# Patient Record
Sex: Female | Born: 1955 | Race: White | Hispanic: No | Marital: Married | Smoking: Former smoker
Health system: Southern US, Community
[De-identification: ages and names within clinical notes are randomized; demographics above are authoritative.]

## PROBLEM LIST (undated history)

## (undated) DIAGNOSIS — C801 Malignant (primary) neoplasm, unspecified: Secondary | ICD-10-CM

## (undated) DIAGNOSIS — E669 Obesity, unspecified: Secondary | ICD-10-CM

## (undated) DIAGNOSIS — E039 Hypothyroidism, unspecified: Secondary | ICD-10-CM

## (undated) DIAGNOSIS — Z9889 Other specified postprocedural states: Secondary | ICD-10-CM

## (undated) HISTORY — DX: Hypothyroidism, unspecified: E03.9

## (undated) HISTORY — DX: Obesity, unspecified: E66.9

## (undated) HISTORY — DX: Malignant (primary) neoplasm, unspecified: C80.1

## (undated) HISTORY — PX: OTHER SURGICAL HISTORY: SHX169

## (undated) HISTORY — PX: ABDOMINAL HYSTERECTOMY: SHX81

---

## 1994-02-09 HISTORY — PX: OTHER SURGICAL HISTORY: SHX169

## 1999-01-13 ENCOUNTER — Other Ambulatory Visit: Admission: RE | Admit: 1999-01-13 | Discharge: 1999-01-13 | Payer: Self-pay | Admitting: Gynecology

## 1999-01-30 ENCOUNTER — Ambulatory Visit (HOSPITAL_COMMUNITY): Admission: RE | Admit: 1999-01-30 | Discharge: 1999-01-30 | Payer: Self-pay | Admitting: Gynecology

## 1999-01-30 ENCOUNTER — Encounter: Payer: Self-pay | Admitting: Gynecology

## 1999-02-10 HISTORY — PX: BREAST SURGERY: SHX581

## 1999-02-14 ENCOUNTER — Ambulatory Visit (HOSPITAL_COMMUNITY): Admission: RE | Admit: 1999-02-14 | Discharge: 1999-02-14 | Payer: Self-pay | Admitting: Gynecology

## 1999-02-14 ENCOUNTER — Encounter: Payer: Self-pay | Admitting: Gynecology

## 1999-03-19 ENCOUNTER — Encounter (INDEPENDENT_AMBULATORY_CARE_PROVIDER_SITE_OTHER): Payer: Self-pay | Admitting: Specialist

## 1999-03-19 ENCOUNTER — Other Ambulatory Visit: Admission: RE | Admit: 1999-03-19 | Discharge: 1999-03-19 | Payer: Self-pay | Admitting: Surgery

## 1999-08-19 ENCOUNTER — Encounter: Payer: Self-pay | Admitting: Surgery

## 1999-08-19 ENCOUNTER — Encounter: Admission: RE | Admit: 1999-08-19 | Discharge: 1999-08-19 | Payer: Self-pay | Admitting: Surgery

## 2000-01-16 ENCOUNTER — Other Ambulatory Visit: Admission: RE | Admit: 2000-01-16 | Discharge: 2000-01-16 | Payer: Self-pay | Admitting: Gynecology

## 2001-02-24 ENCOUNTER — Other Ambulatory Visit: Admission: RE | Admit: 2001-02-24 | Discharge: 2001-02-24 | Payer: Self-pay | Admitting: Gynecology

## 2002-05-18 ENCOUNTER — Other Ambulatory Visit: Admission: RE | Admit: 2002-05-18 | Discharge: 2002-05-18 | Payer: Self-pay | Admitting: Gynecology

## 2003-02-16 ENCOUNTER — Ambulatory Visit (HOSPITAL_COMMUNITY): Admission: RE | Admit: 2003-02-16 | Discharge: 2003-02-16 | Payer: Self-pay | Admitting: Internal Medicine

## 2003-03-07 ENCOUNTER — Ambulatory Visit (HOSPITAL_COMMUNITY): Admission: RE | Admit: 2003-03-07 | Discharge: 2003-03-07 | Payer: Self-pay | Admitting: Internal Medicine

## 2003-03-07 ENCOUNTER — Encounter: Payer: Self-pay | Admitting: Cardiology

## 2003-05-24 ENCOUNTER — Other Ambulatory Visit: Admission: RE | Admit: 2003-05-24 | Discharge: 2003-05-24 | Payer: Self-pay | Admitting: Gynecology

## 2004-04-17 ENCOUNTER — Encounter (INDEPENDENT_AMBULATORY_CARE_PROVIDER_SITE_OTHER): Payer: Self-pay | Admitting: Specialist

## 2004-04-17 ENCOUNTER — Ambulatory Visit (HOSPITAL_BASED_OUTPATIENT_CLINIC_OR_DEPARTMENT_OTHER): Admission: RE | Admit: 2004-04-17 | Discharge: 2004-04-17 | Payer: Self-pay | Admitting: Gynecology

## 2004-06-20 ENCOUNTER — Other Ambulatory Visit: Admission: RE | Admit: 2004-06-20 | Discharge: 2004-06-20 | Payer: Self-pay | Admitting: Gynecology

## 2005-03-24 ENCOUNTER — Ambulatory Visit: Payer: Self-pay | Admitting: Internal Medicine

## 2005-03-25 ENCOUNTER — Ambulatory Visit: Payer: Self-pay | Admitting: Internal Medicine

## 2005-03-26 ENCOUNTER — Encounter: Admission: RE | Admit: 2005-03-26 | Discharge: 2005-06-24 | Payer: Self-pay | Admitting: Gynecology

## 2005-06-10 ENCOUNTER — Other Ambulatory Visit: Admission: RE | Admit: 2005-06-10 | Discharge: 2005-06-10 | Payer: Self-pay | Admitting: Gynecology

## 2005-06-30 ENCOUNTER — Encounter: Admission: RE | Admit: 2005-06-30 | Discharge: 2005-06-30 | Payer: Self-pay | Admitting: Endocrinology

## 2005-07-14 ENCOUNTER — Other Ambulatory Visit: Admission: RE | Admit: 2005-07-14 | Discharge: 2005-07-14 | Payer: Self-pay | Admitting: Interventional Radiology

## 2005-07-14 ENCOUNTER — Encounter: Admission: RE | Admit: 2005-07-14 | Discharge: 2005-07-14 | Payer: Self-pay | Admitting: Endocrinology

## 2005-07-14 ENCOUNTER — Encounter (INDEPENDENT_AMBULATORY_CARE_PROVIDER_SITE_OTHER): Payer: Self-pay | Admitting: *Deleted

## 2005-11-30 ENCOUNTER — Encounter: Admission: RE | Admit: 2005-11-30 | Discharge: 2005-11-30 | Payer: Self-pay | Admitting: Endocrinology

## 2006-05-11 HISTORY — PX: ENDOMETRIAL ABLATION: SHX621

## 2006-05-19 ENCOUNTER — Encounter (INDEPENDENT_AMBULATORY_CARE_PROVIDER_SITE_OTHER): Payer: Self-pay | Admitting: *Deleted

## 2006-05-19 ENCOUNTER — Ambulatory Visit (HOSPITAL_BASED_OUTPATIENT_CLINIC_OR_DEPARTMENT_OTHER): Admission: RE | Admit: 2006-05-19 | Discharge: 2006-05-19 | Payer: Self-pay | Admitting: Gynecology

## 2006-05-27 ENCOUNTER — Encounter: Admission: RE | Admit: 2006-05-27 | Discharge: 2006-05-27 | Payer: Self-pay | Admitting: Endocrinology

## 2006-06-15 ENCOUNTER — Other Ambulatory Visit: Admission: RE | Admit: 2006-06-15 | Discharge: 2006-06-15 | Payer: Self-pay | Admitting: Gynecology

## 2006-10-18 ENCOUNTER — Encounter: Payer: Self-pay | Admitting: Internal Medicine

## 2006-10-18 DIAGNOSIS — E039 Hypothyroidism, unspecified: Secondary | ICD-10-CM | POA: Insufficient documentation

## 2006-10-18 DIAGNOSIS — R609 Edema, unspecified: Secondary | ICD-10-CM | POA: Insufficient documentation

## 2007-07-06 ENCOUNTER — Other Ambulatory Visit: Admission: RE | Admit: 2007-07-06 | Discharge: 2007-07-06 | Payer: Self-pay | Admitting: Gynecology

## 2007-11-10 ENCOUNTER — Ambulatory Visit: Payer: Self-pay | Admitting: Gynecology

## 2008-01-18 ENCOUNTER — Ambulatory Visit: Payer: Self-pay | Admitting: Gynecology

## 2008-01-19 ENCOUNTER — Ambulatory Visit: Payer: Self-pay | Admitting: Genetic Counselor

## 2008-01-27 ENCOUNTER — Ambulatory Visit: Payer: Self-pay | Admitting: Gynecology

## 2008-07-06 ENCOUNTER — Other Ambulatory Visit: Admission: RE | Admit: 2008-07-06 | Discharge: 2008-07-06 | Payer: Self-pay | Admitting: Gynecology

## 2008-07-06 ENCOUNTER — Encounter: Payer: Self-pay | Admitting: Gynecology

## 2008-07-06 ENCOUNTER — Ambulatory Visit: Payer: Self-pay | Admitting: Gynecology

## 2008-07-16 ENCOUNTER — Ambulatory Visit: Payer: Self-pay | Admitting: Gynecology

## 2008-07-19 ENCOUNTER — Encounter: Admission: RE | Admit: 2008-07-19 | Discharge: 2008-07-19 | Payer: Self-pay | Admitting: Endocrinology

## 2009-08-07 ENCOUNTER — Ambulatory Visit: Payer: Self-pay | Admitting: Gynecology

## 2009-08-07 ENCOUNTER — Other Ambulatory Visit: Admission: RE | Admit: 2009-08-07 | Discharge: 2009-08-07 | Payer: Self-pay | Admitting: Gynecology

## 2009-08-19 ENCOUNTER — Encounter: Admission: RE | Admit: 2009-08-19 | Discharge: 2009-08-19 | Payer: Self-pay | Admitting: Endocrinology

## 2009-10-03 ENCOUNTER — Ambulatory Visit: Payer: Self-pay | Admitting: Gynecology

## 2009-12-06 ENCOUNTER — Encounter: Admission: RE | Admit: 2009-12-06 | Discharge: 2009-12-06 | Payer: Self-pay | Admitting: Endocrinology

## 2010-03-02 ENCOUNTER — Encounter: Payer: Self-pay | Admitting: Surgery

## 2010-06-27 NOTE — Op Note (Signed)
[name], Carrie              ACCOUNT NO.:  [id number]   MEDICAL RECORD NO.:  [id number]          PATIENT TYPE:  AMB   LOCATION:  [facility]                         FACILITY:  [facility]   PHYSICIAN:  Juan H. Lily Peer, M.D.DATE OF BIRTH:  October 25, 1955   DATE OF PROCEDURE:  05/19/2006  DATE OF DISCHARGE:                               OPERATIVE REPORT   SURGEON:  Juan H. Lily Peer, M.D.   INDICATIONS FOR SURGERY:  A 55 year old gravida 2, para 2, menopause  patient with submucous myoma.   PREOPERATIVE DIAGNOSES:  1,  Submucous myoma.  2  Postmenopausal.   POSTOPERATIVE DIAGNOSES:  1,  Submucous myoma.  2  Postmenopausal.   ANESTHESIA:  General endotracheal anesthesia.   PROCEDURE PERFORMED:  Resectoscopic endometrial myomectomy.   FINDINGS:  Normal cervical canal.  A 2 x 3 cm submucous myoma with a  base from the anterior uterine wall.  Normal tubal ostia.  Myoma  hypervascular.   DESCRIPTION OF PROCEDURE:  After the patient was adequate counseled, she  was taken to the operating room where she had undergone successful  general endotracheal anesthesia.  She had received a gram of cefixime  for prophylaxis the day before in the office.  She had a Laminaria place  intracervically which was removed prior to commencing the procedure  today.  The vagina and cervix were prepped and draped in the usual  sterile fashion.  A red rubber Roxan Hockey was then inserted to evacuate  the bladder of its contents of approximately 75 cc.  A single tooth  tenaculum was placed on the anterior cervical lip.  A short weighted  speculum was placed in the posterior vaginal vault and a Sims retractor  anteriorly for exposure.  The operative resectoscope with a 90 degree  wire loop was inserted int the intrauterine cavity.  Three percent  Sorbitol was the distending media.  The settings on the [facility]  electrosurgical generator were 160 watts on the cutting mode and 70  watts on the coagulation mode.  The  base was identified and the  submucous myoma was resected piece by piece, all the way to the base.  Specimen was submitted for histological evaluation.  The operative  resectoscope 90 degree wire loop was then switched to the vapor strobe  and the settings were 90 on the cutting mode and 70 watts on the  coagulation mode.  The base of the myoma was ablated for additional  hemostasis,  pre and post procedures were obtained.  The patient  tolerated the procedure well.  The anterior cervical single tooth tenaculum was removed.  The patient  was extubated, transferred to the recovery room with stable vital signs.  Blood loss was minimal.  Fluid resuscitation consisted of 600 cc of  lactated Ringer's.  Three percent Sorbitol fluid deficit was 200 cc.  She received 30 mg of Toradol en route to the recovery room.      Juan H. Lily Peer, M.D.  Electronically Signed     JHF/MEDQ  D:  05/19/2006  T:  05/19/2006  Job:  [id number]

## 2010-06-27 NOTE — H&P (Signed)
NAMEVEDIKA, DUMLAO              ACCOUNT NO.:  [id number]   MEDICAL RECORD NO.:  [id number]          PATIENT TYPE:  AMB   LOCATION:  [facility]                         FACILITY:  [facility]   PHYSICIAN:  Juan H. Lily Peer, M.D.DATE OF BIRTH:  Jun 18, 1955   DATE OF ADMISSION:  04/17/2004  DATE OF DISCHARGE:                                HISTORY & PHYSICAL   The patient is a 55 year old, gravida 2, para 2 who had been evaluated for  dysfunctional uterine bleeding. She had an endometrial biopsy that had been  done on April 24, 2003 whereby she had focal simple hyperplasia without  atypia. She had been placed on progesterone 10 mg q. daily for 10 days of  each month and had followed biopsy in July of last year with resolution of  the hyperplasia. She had returned back to the office on January 18  complaining of postmenopausal bleeding once again.  She is not on any  hormone replacement therapy. She had an endometrial biopsy done on January  18 demonstrating benign secretory endometrium with small focal breakdown. We  went ahead and did a sonohysterogram  and because of her continued bleeding  and the ultrasound had demonstrated a normal uterus measuring 10.3 x 5.3 x  7.1 cm.  The right ovary was not seen vaginally or transabdominally, the  left ovary had a small simple echo free cyst that measured 9 x 6 mm.  The  sonohysterogram demonstrated a uterine polypoid like lesion measuring 3.3 x  1.0 x 1.3 cm inside, fleshy in appearance. We discussed whether she may want  to proceed with a hysterectomy versus a resectoscopic polypectomy and less  invasive procedure. 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  The patient was to receive a Laminaria placed intravaginally  the day before her surgery and she was to continue on her Megace 20 mg  b.i.d. to help control her bleeding. The patient's last Pap smear  had been  on April of 2005 which had been normal.   PAST MEDICAL HISTORY:  The patient denies any allergies, she has had one  cesarean section, one vaginal delivery. She has a history of hypothyroidism,  she is on 125 mcg of Synthroid daily.  She is also taking multivitamins and  also she is taking calcium 1200-1500 mg q. daily.   PAST SURGICAL HISTORY:  Consisted of thyroidectomy in 1996, she has had one  cesarean section, left breast biopsy in 2001.   FAMILY HISTORY:  Mother with a history of breast cancer and two aunts with  breast cancer as well.   PHYSICAL EXAMINATION:  VITAL SIGNS:  The patient weighs 210 pounds, 5 feet 3  1/2 inches tall. Blood pressure 120/72.  HEENT:  Unremarkable.  NECK:  Supple, trachea midline. No carotid bruits, no thyromegaly.  LUNGS:  Clear to auscultation without rhonchi or wheezes.  HEART:  Regular rate and rhythm, no murmurs or gallops.  BREASTS:  Exam not done.  ABDOMEN:  Soft, nontender without rebound or guarding.  PELVIC:  Bartholin, urethra and Skene's glands within normal limits. Vagina  and cervix no lesions or discharge. Uterus anteverted, normal size, shape  and consistency.  Adnexa no mass or tenderness.   ASSESSMENT:  A 55 year old, gravida 2, para 2 with dysfunctional uterine  bleeding, history of simple hyperplasia __________ and follow Pap smear with  resolution of the hyperplasia but continued to bleed.  Sonohysterogram  demonstrated endometrial polyp. The patient is scheduled to undergo  resectoscopic polypectomy, D&C at [facility] on Thursday,  March 9. The risks, benefits, pros and cons of the procedure are discussed  with the patient to include infection, bleeding, trauma to internal organs  were discussed. All questions were answered and will follow accordingly.   PLAN:  The patient is scheduled for resectoscopic polypectomy on Thursday,  March 9 at [facility].      JHF/MEDQ  D:  04/16/2004   T:  04/16/2004  Job:  [id number]

## 2010-06-27 NOTE — H&P (Signed)
[name], [name]              ACCOUNT NO.:  [id number]   MEDICAL RECORD NO.:  [id number]          PATIENT TYPE:  AMB   LOCATION:  [facility]                         FACILITY:  [facility]   PHYSICIAN:  Juan H. Lily Peer, M.D.DATE OF BIRTH:  Mar 01, 1955   DATE OF ADMISSION:  DATE OF DISCHARGE:                              HISTORY & PHYSICAL   CHIEF COMPLAINT:  Postmenopausal endometrial polyp.   HISTORY:  The patient is a 55 year old gravida 2, para 2, who is  scheduled to undergo resective scopic polypectomy at [facility] secondary to a postmenopausal endometrial polyp that had been  noted at time of an ultrasound secondary to lower abdominal discomfort  the patient was experiencing.  She has not had any menstrual period.  She did have an endometrial biopsy in the office in February 2008  proliferative type of endometrium.  The patient is slightly obese.  She  had had a resective scopic polypectomy back in 2006.  The ultrasound  done in the office on September 16, 2005, had demonstrated a 25 x 10 x 15 mm  echogenic defect, possibly submucous myoma.  The patient originally set  to undergo definitive surgery because of her pelvic pains such as with a  hysterectomy but she had postponed and says her pain has gotten better,  but she would like to proceed with an outpatient procedure, and for this  reason the planned resective scopic polypectomy is scheduled.  She had  cervical laminaria placed today at the time of the preoperative  consultation.   PAST MEDICAL HISTORY:  The patient has had one C-section and 1 vaginal  delivery.  She has been under the care of Dr. __________ for  hypothyroidism.  The patient has had, in the past, a history of  endometrial hyperplasia for which she was treated with progestational  therapy for a 3 month period with subsequent normal follow-up  endometrial biopsy.   MEDICATIONS:  She is on Levothyroxine 112 mcg daily.  She is taking  multivitamins,  calcium with vitamin D.   PAST SURGERIES:  Thyroidectomy in 1996.  C-section.  Left breast biopsy  2001.  Resective scopic polypectomy March 2006.   FAMILY HISTORY:  Mother with breast cancer and an aunt with breast  cancer.   PHYSICAL EXAMINATION:  VITAL SIGNS:  The patient weighs 233 pounds.  Blood pressure 120/80.  HEENT:  Unremarkable.  NECK:  Supple.  Trachea midline.  No carotid bruits.  No thyromegaly.  LUNGS:  Clear to auscultation without rhonchi or wheezes.  HEART:  Regular rate and rhythm.  No murmurs or gallops.  BREAST EXAM:  At the time of her annual exam was normal.  ABDOMEN:  Soft, nontender.  PELVIC:  BUS within normal limits.  VAGINA/CERVIX:  No lesions or discharge.  UTERUS:  Anteverted.  Normal in size, shape, and consistency.  ADNEXA:  Without any masses or tenderness.  RECTAL EXAM:  Not done.   ASSESSMENT:  Menopausal patient, 55 years of age, gravida 2, para 2,  with prior history in 2005 of endometrial hyperplasia, treated with  progestational therapy in 2006, had  a resective scopic polypectomy which  was benign, and now the patient is with a submucous myoma versus polyp,  postmenopausal.  The patient is scheduled to undergo resective scopic  polypectomy/submucous myoma at [facility] tomorrow,  May 19, 2006, at 8:30 a.m.  The patient had an intracervical laminaria  placed in the office today.  The risks, benefits, pros, and cons of the  operation were discussed to include infection, bleeding, trauma to  internal organs, and perforation as a result of perforation __________.  All these issues were discussed with the patient.  She was given a  prescription for Lortab 7.5/500 to take 1 p.o. q.4-6 hours p.r.n. pain  in the event she may have some discomfort tonight.  All questions were  answered and will follow accordingly.      Juan H. Lily Peer, M.D.  Electronically Signed     JHF/MEDQ  D:  05/18/2006  T:  05/18/2006  Job:  [id number]

## 2010-06-27 NOTE — Op Note (Signed)
NAMEKRYSTALYN, [name]              ACCOUNT NO.:  [id number]   MEDICAL RECORD NO.:  [id number]          PATIENT TYPE:  AMB   LOCATION:  [facility]                         FACILITY:  [facility]   PHYSICIAN:  Juan H. Lily Peer, M.D.DATE OF BIRTH:  December 17, 1955   DATE OF PROCEDURE:  04/17/2004  DATE OF DISCHARGE:                                 OPERATIVE REPORT   PREOPERATIVE DIAGNOSES:  1.  Dysfunctional uterine bleeding.  2.  Endometrial polyp.  3.  Right inner thigh skin tag.   POSTOPERATIVE DIAGNOSES:  1.  Dysfunctional uterine bleeding.  2.  Endometrial polyp.  3.  Right inner thigh skin tag.   PROCEDURE:  1.  Diagnostic hysteroscopy.  2.  Resectoscopic polypectomy.  3.  Excision of right inner thigh skin tag.   ANESTHESIA:  General endotracheal anesthesia.   SURGEON:  Juan H. Lily Peer, M.D.   INDICATIONS FOR PROCEDURE:  The patient is a 55 year old gravida 2, para 2  with dysfunctional uterine bleeding and prior history of simple hyperplasia  treated with progestinational agent for three months.  Followup biopsy now  with evidence of hyperplasia.  The patient continued to bleed.  Sonohistogram demonstrated endometrial polyp.   FINDINGS:  Patient with an intrauterine polyp measuring 2 x 3-cm attached  anteriorly.  Lush endometrium.  Tubal osteum, one identified on the left,  but the right was not seen.  Smooth endocervical canal.  Patient also with a  2 x 2-cm skin tag in the right inner thigh.   DESCRIPTION OF PROCEDURE:  After the patient was adequately counseled, she  was taken to the operating room where she underwent successful general  endotracheal anesthesia.  Her vagina was prepped and draped in the usual  sterile fashion.  The patient has PSA stockings for DVT prophylaxis, and she  also received a gram of Cefotan for prophylaxis as well.  The patient had  passed spontaneously at home during a bowel movement the laminaria that had  been placed intracervically the day  before in the office.  The bladder was  evacuated of its contents for approximately 50 cc.  Examination demonstrated  under anesthesia that she had an anteverted uterus, no palpable adnexal  masses.   A short weighted billed speculum was placed in the posterior vaginal vault  and Sims retractor anteriorly for exposure.  A single-tooth tenaculum was  placed on the anterior cervical lip after the cervix had been cleansed with  Betadine solution.  The cervical canal required very minimal dilatation due  to the fact that she had a laminaria the night before.  The operative  resectoscope with a 90-degree wire loop was inserted into the intrauterine  cavity.  2% sorbitol was used as the distending media.  The resectoscope was  inserted.  A smooth endocervical canal was noted, and a 2 x 3-cm polypoid  lesion was noted on the anterior wall.  With the [employer] electrosurgical  generator set at 80 watts on coagulation, the polyp was resected and  submitted for histological evaluation.  This was interchanged with the  suction curettage and submitted also along with a blunt curettage  for  histological evaluation.  The patient tolerated the procedure well.  The  single-tooth tenaculum was removed.   Attention was placed to the right inner thigh skin tag which was excised  with a scalpel.  The base was fully cauterized, and the skin edges were  approximated with 4-0 Vicryl suture.  Marcaine 0.5% for 5 cc was infiltrated  into the incision site for postoperative analgesia.   The patient was awakened and transferred to the recovery room with stable  vital signs.  Blood loss from the procedure was minimal.  IV fluids  consisted of 900 cc of lactated Ringer's and 2% sorbitol distending media.  The sorbitol deficit was 135 cc.      JHF/MEDQ  D:  04/17/2004  T:  04/17/2004  Job:  [id number]

## 2010-08-11 ENCOUNTER — Encounter (INDEPENDENT_AMBULATORY_CARE_PROVIDER_SITE_OTHER): Payer: BC Managed Care – PPO | Admitting: Gynecology

## 2010-08-11 ENCOUNTER — Other Ambulatory Visit: Payer: Self-pay | Admitting: Gynecology

## 2010-08-11 ENCOUNTER — Other Ambulatory Visit (HOSPITAL_COMMUNITY)
Admission: RE | Admit: 2010-08-11 | Discharge: 2010-08-11 | Disposition: A | Discharge status: Home / Self Care | Payer: BC Managed Care – PPO | Source: Ambulatory Visit | Attending: Gynecology | Admitting: Gynecology

## 2010-08-11 DIAGNOSIS — Z124 Encounter for screening for malignant neoplasm of cervix: Secondary | ICD-10-CM | POA: Insufficient documentation

## 2010-08-11 DIAGNOSIS — Z1211 Encounter for screening for malignant neoplasm of colon: Secondary | ICD-10-CM

## 2010-08-11 DIAGNOSIS — Z01419 Encounter for gynecological examination (general) (routine) without abnormal findings: Secondary | ICD-10-CM

## 2010-08-12 ENCOUNTER — Other Ambulatory Visit (INDEPENDENT_AMBULATORY_CARE_PROVIDER_SITE_OTHER): Payer: BC Managed Care – PPO

## 2010-08-12 ENCOUNTER — Other Ambulatory Visit: Payer: Self-pay | Admitting: Endocrinology

## 2010-08-12 DIAGNOSIS — E049 Nontoxic goiter, unspecified: Secondary | ICD-10-CM

## 2010-08-12 DIAGNOSIS — Z01419 Encounter for gynecological examination (general) (routine) without abnormal findings: Secondary | ICD-10-CM

## 2010-08-12 DIAGNOSIS — Z13220 Encounter for screening for lipoid disorders: Secondary | ICD-10-CM

## 2010-11-17 ENCOUNTER — Ambulatory Visit
Admission: RE | Admit: 2010-11-17 | Discharge: 2010-11-17 | Disposition: A | Discharge status: Home / Self Care | Payer: BC Managed Care – PPO | Source: Ambulatory Visit | Attending: Endocrinology | Admitting: Endocrinology

## 2010-11-17 DIAGNOSIS — E049 Nontoxic goiter, unspecified: Secondary | ICD-10-CM

## 2011-02-10 DIAGNOSIS — Z9889 Other specified postprocedural states: Secondary | ICD-10-CM

## 2011-02-10 HISTORY — PX: OTHER SURGICAL HISTORY: SHX169

## 2011-02-10 HISTORY — DX: Other specified postprocedural states: Z98.890

## 2011-02-25 ENCOUNTER — Emergency Department (HOSPITAL_COMMUNITY)
Admission: EM | Admit: 2011-02-25 | Discharge: 2011-02-25 | Disposition: A | Discharge status: Home / Self Care | Payer: BC Managed Care – PPO | Attending: Emergency Medicine | Admitting: Emergency Medicine

## 2011-02-25 ENCOUNTER — Encounter (HOSPITAL_COMMUNITY): Payer: Self-pay | Admitting: Emergency Medicine

## 2011-02-25 DIAGNOSIS — R51 Headache: Secondary | ICD-10-CM | POA: Insufficient documentation

## 2011-02-25 DIAGNOSIS — R1110 Vomiting, unspecified: Secondary | ICD-10-CM | POA: Insufficient documentation

## 2011-02-25 DIAGNOSIS — E039 Hypothyroidism, unspecified: Secondary | ICD-10-CM | POA: Insufficient documentation

## 2011-02-25 MED ORDER — ONDANSETRON 8 MG/NS 50 ML IVPB
8.0000 mg | Freq: Once | INTRAVENOUS | Status: AC
  Administered 2011-02-25: 8 mg via INTRAVENOUS
  Filled 2011-02-25: qty 8

## 2011-02-25 MED ORDER — SODIUM CHLORIDE 0.9 % IV BOLUS (SEPSIS)
1000.0000 mL | Freq: Once | INTRAVENOUS | Status: AC
  Administered 2011-02-25: 1000 mL via INTRAVENOUS

## 2011-02-25 NOTE — ED Notes (Signed)
Pt states she has had dry heaves today and came because she is worried that she might be getting sick as her son said yesterday he did not feel good.

## 2011-03-03 NOTE — ED Provider Notes (Signed)
History     56 year old female with a chief complaint of vomiting episodes x3. Nonbilious nonbloody. Onset today. No fever or chills. Patient just had dental work today. This is routine cleaning. She did not receive any anesthesia or have significant procedures denies abdominal pain or back pain. No urinary complaints. Her son has been feeling ill recently. Mild headache. No visual complaints. Her confusion per patient or her husband. No numbness, weakness or loss of strength. Denies trauma    CSN: [id number]  Arrival date & time 02/25/11  1759   First MD Initiated Contact with Patient 02/25/11 1904      Chief Complaint  Patient presents with  . Headache    pt reports after leaving dentist today she developed HA, and vomited x's 3 today. pt reports her son was sick on yesterday and she drank after him. pt concerned she may have a virus from son. pt denies fever or sore throat.     (Consider location/radiation/quality/duration/timing/severity/associated sxs/prior treatment) HPI  Past Medical History  Diagnosis Date  . Hypothyroid   . Endometrial hyperplasia 05/2003    Past Surgical History  Procedure Date  . Thyoidectomy 1996  . Cesarean section   . Breast surgery 2001    BREAST BIOPSY  . Resectoscopic polypectomy 04/2004, 05/2006  . Endometrial ablation 05/2006    RESECTOSCOPIC POLYPECTOMY AND ABLATION    Family History  Problem Relation Age of Onset  . Breast cancer Mother     NEG BRCA1 AND BRCA 2  . Hypertension Father     History  Substance Use Topics  . Smoking status: Not on file  . Smokeless tobacco: Not on file  . Alcohol Use: No    OB History    Grav Para Term Preterm Abortions TAB SAB Ect Mult Living                  Review of Systems   Review of symptoms negative unless otherwise noted in HPI.   Allergies  Review of patient's allergies indicates no known allergies.  Home Medications   Current Outpatient Rx  Name Route Sig Dispense Refill    . CALCIUM PO Oral Take by mouth. PT TAKES 1200-1500 DAILY.     Marland Kitchen LEVOTHYROXINE SODIUM 125 MCG PO TABS Oral Take 125 mcg by mouth daily.    . MULTIVITAMIN PO Oral Take by mouth.      . OMEGA-3-ACID ETHYL ESTERS 1 G PO CAPS Oral Take 2 g by mouth 2 (two) times daily.      BP 117/68  Pulse 66  Temp(Src) 98.7 F (37.1 C) (Oral)  Resp 17  SpO2 98%  Physical Exam  Nursing note and vitals reviewed. Constitutional: She is oriented to person, place, and time. She appears well-developed and well-nourished. No distress.       Sitting up in bed. No acute distress. Obese.  HENT:  Head: Normocephalic and atraumatic.  Eyes: Conjunctivae are normal. Pupils are equal, round, and reactive to light. Right eye exhibits no discharge. Left eye exhibits no discharge.  Neck: Neck supple.  Cardiovascular: Normal rate, regular rhythm and normal heart sounds.  Exam reveals no gallop and no friction rub.   No murmur heard. Pulmonary/Chest: Effort normal and breath sounds normal. No respiratory distress.  Abdominal: Soft. She exhibits no distension. There is no tenderness.       No distension or tenderness.  Musculoskeletal: She exhibits no edema and no tenderness.  Neurological: She is alert and oriented to person, place,  and time. No cranial nerve deficit. She exhibits normal muscle tone. Coordination normal.       Good finger to nose and heel to shin bilaterally. Normal-appearing gait.  Skin: Skin is warm and dry. She is not diaphoretic.  Psychiatric: She has a normal mood and affect. Her behavior is normal. Thought content normal.    ED Course  Procedures (including critical care time)  Labs Reviewed - No data to display No results found.   1. Vomiting       MDM  56 year-old female with vomiting. Suspect there are other less. Patient with recent visit to dentist for this is routine cleaning and she did not receive any anesthesia or have significant procedures. Patient is afebrile. Abdominal  exam is benign. Low clinical suspicion for acute surgical abdomen. Patient has felt much better after fluids and antiemetics. No vomiting while in the ED. Headache is mild, patient has no neurological complaints and has a nonfocal neurological exam suspect related to retching or vomiting. Low clinical suspicion for emergent or urgent etiology of her headache. Plan continue symptomatic treatment. Return precautions discussed. Outpatient followup as needed.      Raeford Razor, MD 03/03/11 1415

## 2011-03-24 ENCOUNTER — Other Ambulatory Visit: Payer: Self-pay | Admitting: Endocrinology

## 2011-03-24 DIAGNOSIS — E049 Nontoxic goiter, unspecified: Secondary | ICD-10-CM

## 2011-05-27 ENCOUNTER — Other Ambulatory Visit: Payer: Self-pay

## 2011-05-27 DIAGNOSIS — R600 Localized edema: Secondary | ICD-10-CM

## 2011-06-01 ENCOUNTER — Encounter: Payer: Self-pay | Admitting: Vascular Surgery

## 2011-06-02 ENCOUNTER — Ambulatory Visit (INDEPENDENT_AMBULATORY_CARE_PROVIDER_SITE_OTHER): Payer: BC Managed Care – PPO | Admitting: Vascular Surgery

## 2011-06-02 ENCOUNTER — Ambulatory Visit (INDEPENDENT_AMBULATORY_CARE_PROVIDER_SITE_OTHER): Payer: BC Managed Care – PPO | Admitting: *Deleted

## 2011-06-02 ENCOUNTER — Encounter: Payer: Self-pay | Admitting: Vascular Surgery

## 2011-06-02 VITALS — BP 138/84 | HR 64 | Resp 20 | Ht 64.0 in | Wt 249.0 lb

## 2011-06-02 DIAGNOSIS — I872 Venous insufficiency (chronic) (peripheral): Secondary | ICD-10-CM | POA: Insufficient documentation

## 2011-06-02 DIAGNOSIS — R600 Localized edema: Secondary | ICD-10-CM

## 2011-06-02 DIAGNOSIS — R609 Edema, unspecified: Secondary | ICD-10-CM

## 2011-06-02 DIAGNOSIS — M7989 Other specified soft tissue disorders: Secondary | ICD-10-CM

## 2011-06-02 NOTE — Progress Notes (Signed)
Subjective:     Patient ID: [name], female   DOB: 1956-01-26, 56 y.o.   MRN: [id number]  HPI this 56 year old female was referred for bilateral pedal and lower extremity edema right worse than left. She states that she has been having swelling for several years. She bumped her right leg on the jacuzzi a few months ago and this aggravated the swelling in the right leg. She has a history of DVT, thrombophlebitis, stasis ulcer, bleeding, or varicosities. She states that when she wears short leg elastic compression stocking swelling occurs down to the stocking. She has tried to lose weight but has been unable to.  Past Medical History  Diagnosis Date  . Hypothyroid   . Endometrial hyperplasia 05/2003    History  Substance Use Topics  . Smoking status: Never Smoker   . Smokeless tobacco: Not on file  . Alcohol Use: 1.2 oz/week    2 Cans of beer per week    Family History  Problem Relation Age of Onset  . Breast cancer Mother     NEG BRCA1 AND BRCA 2  . Hypertension Father     No Known Allergies  Current outpatient prescriptions:CALCIUM PO, Take by mouth. PT TAKES 1200-1500 DAILY. , Disp: , Rfl: ;  levothyroxine (SYNTHROID, LEVOTHROID) 125 MCG tablet, Take 125 mcg by mouth daily., Disp: , Rfl: ;  Multiple Vitamin (MULTIVITAMIN PO), Take by mouth.  , Disp: , Rfl: ;  omega-3 acid ethyl esters (LOVAZA) 1 G capsule, Take 2 g by mouth 2 (two) times daily., Disp: , Rfl:   BP 138/84  Pulse 64  Resp 20  Ht 5\' 4"  (1.626 m)  Wt 249 lb (112.946 kg)  BMI 42.74 kg/m2  Body mass index is 42.74 kg/(m^2).          Review of Systems denies chest pain, dyspnea on exertion, PND, orthopnea. Does have difficulty with ambulation of long distance. Takes diuretics for her edema on occasion. Has history of hypothyroidism and treated with medication. All other systems are negative and complete review of systems    Objective:   Physical Exam blood pressure 130/84 heart rate 64 respirations  20 Gen.-alert and oriented x3 in no apparent distress HEENT normal for age Lungs no rhonchi or wheezing Cardiovascular regular rhythm no murmurs carotid pulses 3+ palpable no bruits audible Abdomen soft nontender no palpable masses-obese Musculoskeletal free of  major deformities Skin clear -no rashes Neurologic normal Lower extremities 3+ femoral and dorsalis pedis pulses palpable bilaterally with bilateral edema right greater than left about 1+. No ulcerations cellulitis gangrene or varicosities noted. No hyperpigmentation noted. Right ankle and calf 1 cm larger in circumference compared to left  Today I ordered bilateral venous duplex exam which are reviewed and interpreted. There is no DVT. There is mild reflux in both deep venous systems with no obstruction. Superficial systems are generally free of significant reflux     Assessment:     Bilateral edema right greater than left due to deep venous reflux    Plan:     #1 elevate legs at night by elevating foot of bed or mattress #2 compression stockings either short or long leg which ever she tolerates best #3 diuretics as prescribed by Dr. Tiburcio Pea #4 no further suggestions and no further vascular workup needed

## 2011-06-02 NOTE — Progress Notes (Signed)
Addended by: Clementeen Hoof on: 06/02/2011 01:26 PM   Modules accepted: Orders

## 2011-06-09 NOTE — Procedures (Unsigned)
LOWER EXTREMITY VENOUS REFLUX EXAM  INDICATION:  Bilateral edema  EXAM:  Using color-flow imaging and pulse Doppler spectral analysis, the bilateral common femoral, femoral, popliteal, posterior tibial, great and small saphenous veins were evaluated.  There is evidence suggesting mild deep venous insufficiency in the bilateral lower extremities.  The bilateral saphenofemoral junction is slightly incompetent with reflux of >500 milliseconds.  The right GSV is competent.  The left proximal greater saphenous is slightly incompetent with reflux of >500 milliseconds.  The bilateral proximal small saphenous vein demonstrates competency.  GSV Diameter (used if found to be incompetent only)                                           Right    Left Proximal Greater Saphenous Vein           cm       0.45 cm Proximal-to-mid-thigh                     cm       cm Mid thigh                                 cm       cm Mid-distal thigh                          cm       cm Distal thigh                              cm       cm Knee                                      cm       cm  IMPRESSION: 1. The right greater saphenous vein is competent.  The left greater     saphenous vein is slightly incompetent with reflux of >500     milliseconds. 2. The bilateral greater saphenous vein is not tortuous. 3. The deep venous system is slightly incompetent with reflux of >500     milliseconds. 4. The bilateral small saphenous vein is competent. 5. Slow flow visualized in the veins bilaterally. 6. No evidence of deep venous thrombosis bilaterally.       ___________________________________________ Quita Skye Hart [place], M.D.  SS/MEDQ  D:  06/02/2011  T:  06/02/2011  Job:  [id number]

## 2011-09-10 ENCOUNTER — Ambulatory Visit: Payer: BC Managed Care – PPO | Admitting: *Deleted

## 2011-09-18 ENCOUNTER — Encounter: Payer: Self-pay | Admitting: Gynecology

## 2011-09-24 ENCOUNTER — Encounter: Payer: BC Managed Care – PPO | Admitting: Gynecology

## 2011-09-30 DIAGNOSIS — Z1211 Encounter for screening for malignant neoplasm of colon: Secondary | ICD-10-CM

## 2011-10-01 ENCOUNTER — Encounter: Payer: Self-pay | Admitting: Gynecology

## 2011-10-01 ENCOUNTER — Ambulatory Visit (INDEPENDENT_AMBULATORY_CARE_PROVIDER_SITE_OTHER): Payer: BC Managed Care – PPO | Admitting: Gynecology

## 2011-10-01 VITALS — BP 118/72 | Ht 62.75 in | Wt 255.0 lb

## 2011-10-01 DIAGNOSIS — Z01419 Encounter for gynecological examination (general) (routine) without abnormal findings: Secondary | ICD-10-CM

## 2011-10-01 DIAGNOSIS — Z780 Asymptomatic menopausal state: Secondary | ICD-10-CM

## 2011-10-01 NOTE — Progress Notes (Signed)
[name] 1955/12/03 [id number]   History:    56 y.o.  for annual gyn exam with no complaints today. Review of her record indicated she was weighing 235 is up to 255. Her last mammogram was August of this year which was reported to be normal. She does her monthly self breast examination. She still is adamant on obtaining a colonoscopy. Dr. Lurene Shadow endocrinologist is been following her for her hypothyroidism and Dr. Johny Blamer is her family Dr. who recently did all her lab work. Her last bone density study was normal in August 2011. Patient with past history of resectoscopic polypectomy in 2006 and endometrial ablation and resectoscopic polypectomy in 2008. Mother with history of breast cancer. Patient negative for the BRCA one BRCA2 gene.  Past medical history,surgical history, family history and social history were all reviewed and documented in the EPIC chart.  Gynecologic History No LMP recorded. Patient is postmenopausal. Contraception: none Last Pap: 2012. Results were: normal Last mammogram: 2013. Results were: normal  Obstetric History OB History    Grav Para Term Preterm Abortions TAB SAB Ect Mult Living   2 2        2      # Outc Date GA Lbr Len/2nd Wgt Sex Del Anes PTL Lv   1 PAR            2 PAR                ROS: A ROS was performed and pertinent positives and negatives are included in the history.  GENERAL: No fevers or chills. HEENT: No change in vision, no earache, sore throat or sinus congestion. NECK: No pain or stiffness. CARDIOVASCULAR: No chest pain or pressure. No palpitations. PULMONARY: No shortness of breath, cough or wheeze. GASTROINTESTINAL: No abdominal pain, nausea, vomiting or diarrhea, melena or bright red blood per rectum. GENITOURINARY: No urinary frequency, urgency, hesitancy or dysuria. MUSCULOSKELETAL: No joint or muscle pain, no back pain, no recent trauma. DERMATOLOGIC: No rash, no itching, no lesions. ENDOCRINE: No polyuria, polydipsia, no heat  or cold intolerance. No recent change in weight. HEMATOLOGICAL: No anemia or easy bruising or bleeding. NEUROLOGIC: No headache, seizures, numbness, tingling or weakness. PSYCHIATRIC: No depression, no loss of interest in normal activity or change in sleep pattern.     Exam: chaperone present  BP 118/72  Ht 5' 2.75" (1.594 m)  Wt 255 lb (115.667 kg)  BMI 45.53 kg/m2  Body mass index is 45.53 kg/(m^2).  General appearance : Well developed well nourished female. No acute distress HEENT: Neck supple, trachea midline, no carotid bruits, no thyroidmegaly Lungs: Clear to auscultation, no rhonchi or wheezes, or rib retractions  Heart: Regular rate and rhythm, no murmurs or gallops Breast:Examined in sitting and supine position were symmetrical in appearance, no palpable masses or tenderness,  no skin retraction, no nipple inversion, no nipple discharge, no skin discoloration, no axillary or supraclavicular lymphadenopathy Abdomen: no palpable masses or tenderness, no rebound or guarding Extremities: no edema or skin discoloration or tenderness  Pelvic:  Bartholin, Urethra, Skene Glands: Within normal limits             Vagina: No gross lesions or discharge  Cervix: No gross lesions or discharge  Uterus  anteverted, normal size, shape and consistency, non-tender and mobile  Adnexa  Without masses or tenderness  Anus and perineum  normal   Rectovaginal  normal sphincter tone without palpated masses or tenderness  Hemoccult cards provided     Assessment/Plan:  56 y.o. female for annual exam with continued issues on her weight. We discussed importance of regular exercise 45 minutes 3 times a week. We discussed importance of calcium vitamin D for osteoporosis prevention. Dr. Johny Blamer general her lab work reached recently as well as Dr. Lurene Shadow and no changes on her current dosage on her Synthroid. New Pap smear screening guidelines discussed. No Pap smear done today. Once again  we discussed the importance of colonoscopy. The name of gastroenterologist was provided. Hemoccult cards were provided for her to submit to the office for testing.    Ok Edwards MD, 1:58 PM 10/01/2011

## 2011-10-01 NOTE — Patient Instructions (Addendum)

## 2011-10-07 ENCOUNTER — Other Ambulatory Visit: Payer: Self-pay | Admitting: Gynecology

## 2011-10-07 DIAGNOSIS — Z1211 Encounter for screening for malignant neoplasm of colon: Secondary | ICD-10-CM

## 2011-10-13 ENCOUNTER — Ambulatory Visit (INDEPENDENT_AMBULATORY_CARE_PROVIDER_SITE_OTHER): Payer: BC Managed Care – PPO

## 2011-10-13 DIAGNOSIS — Z780 Asymptomatic menopausal state: Secondary | ICD-10-CM

## 2011-10-13 DIAGNOSIS — Z13820 Encounter for screening for osteoporosis: Secondary | ICD-10-CM

## 2011-10-26 ENCOUNTER — Encounter: Payer: Self-pay | Admitting: *Deleted

## 2011-10-26 ENCOUNTER — Encounter: Payer: BC Managed Care – PPO | Attending: Endocrinology | Admitting: *Deleted

## 2011-10-26 DIAGNOSIS — Z713 Dietary counseling and surveillance: Secondary | ICD-10-CM | POA: Insufficient documentation

## 2011-10-26 DIAGNOSIS — R7309 Other abnormal glucose: Secondary | ICD-10-CM | POA: Insufficient documentation

## 2011-10-26 DIAGNOSIS — E669 Obesity, unspecified: Secondary | ICD-10-CM | POA: Insufficient documentation

## 2011-10-26 NOTE — Patient Instructions (Addendum)
Plan: Aim for 2 Carb choices per meal (30 grams) Aim for 0-1 Carbs per snack if hungry Consider being active 4-5 days a week for 30 minutes or more Consider reading food labels for total carbohydrate and fat grams Consider using APPS like Calorie Brooke Dare for more nutrition information

## 2011-10-26 NOTE — Progress Notes (Signed)
  Medical Nutrition Therapy:  Appt start time: 1500 end time:  1600.  Assessment:  Primary concerns today: patient here for obesity and impaired fasting blood glucose. She states history of obesity as an adult in conjunction with hypothyroidism. Also states history of elevated A1c to 9.0%, but current A1c is controlledl @ 5.2% She states she has tried multiple diets over the years including Atkins, DASH and Mediterranean. 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  MEDICATIONS: see list   DIETARY INTAKE:  Usual eating pattern includes 3 meals and 0-2 snacks per day.  Everyday foods include good variety of all food groups.  Avoided foods include fried or sweet desserts.    24-hr recall:  B (11 AM): eggs, oatmeal, cream cheese on wheat baguettes, hot tea  Snk (2 PM):  L ( PM): cup of coffee with cereal and fresh fruit Snk ( PM): none D (7-9 PM): lean meat, vegetables, occasional starch, occasional hot bread, Snk (11 PM): hot tea and fresh fruit Beverages: hot tea, coffee,   Usual physical activity: goes to gym 2-3 times a week for about an hour Zumba Classes or Step Class or weights  Estimated energy needs: 1200 calories 135 g carbohydrates 90 g protein 33 g fat  Progress Towards Goal(s):  In progress.   Nutritional Diagnosis:  NI-1.5 Excessive energy intake As related to activity level.  As evidenced by BMI of 47.3 .    Intervention:  Nutrition counseling and basic diabetes education provided. Discussed caloric value of macro-nutrients, effect of carbohydrate on BG as well as weight management and taught carb counting as method for assessing appropriate food choices. Discussed value of SMBG and suggested she considered checking her BG more often if A1c starts to climb in the future. Also discussed rationale of increasing her activity level to 4-5 days a week for weight loss benefit. Plan: Aim  for 2 Carb choices per meal (30 grams) Aim for 0-1 Carbs per snack if hungry Consider being active 4-5 days a week for 30 minutes or more Consider reading food labels for total carbohydrate and fat grams Consider using APPS like Calorie Brooke Dare for more nutrition information  Handouts given during visit include: Carb Counting and Food Label handouts Meal Plan Card APPS such as Calorie Scientist, forensic Sheet  Monitoring/Evaluation:  Dietary intake, exercise, reading food labels, and body weight prn.

## 2011-11-16 ENCOUNTER — Other Ambulatory Visit: Payer: BC Managed Care – PPO

## 2011-11-19 ENCOUNTER — Ambulatory Visit
Admission: RE | Admit: 2011-11-19 | Discharge: 2011-11-19 | Disposition: A | Discharge status: Home / Self Care | Payer: BC Managed Care – PPO | Source: Ambulatory Visit | Attending: Endocrinology | Admitting: Endocrinology

## 2011-11-19 DIAGNOSIS — E049 Nontoxic goiter, unspecified: Secondary | ICD-10-CM

## 2012-03-03 ENCOUNTER — Other Ambulatory Visit: Payer: Self-pay | Admitting: Endocrinology

## 2012-03-03 DIAGNOSIS — E049 Nontoxic goiter, unspecified: Secondary | ICD-10-CM

## 2012-09-22 ENCOUNTER — Encounter: Payer: Self-pay | Admitting: Gynecology

## 2012-10-03 ENCOUNTER — Encounter: Payer: Self-pay | Admitting: Gynecology

## 2012-10-03 ENCOUNTER — Ambulatory Visit (INDEPENDENT_AMBULATORY_CARE_PROVIDER_SITE_OTHER): Payer: BC Managed Care – PPO | Admitting: Gynecology

## 2012-10-03 VITALS — BP 124/80 | Ht 62.5 in | Wt 258.0 lb

## 2012-10-03 DIAGNOSIS — Z23 Encounter for immunization: Secondary | ICD-10-CM

## 2012-10-03 DIAGNOSIS — Z01419 Encounter for gynecological examination (general) (routine) without abnormal findings: Secondary | ICD-10-CM

## 2012-10-03 NOTE — Patient Instructions (Addendum)
Tetanus, Diphtheria, Pertussis (Tdap) Vaccine What You Need to Know WHY GET VACCINATED? Tetanus, diphtheria and pertussis can be very serious diseases, even for adolescents and adults. Tdap vaccine can protect us from these diseases. TETANUS (Lockjaw) causes painful muscle tightening and stiffness, usually all over the body.  It can lead to tightening of muscles in the head and neck so you can't open your mouth, swallow, or sometimes even breathe. Tetanus kills about 1 out of 5 people who are infected. DIPHTHERIA can cause a thick coating to form in the back of the throat.  It can lead to breathing problems, paralysis, heart failure, and death. PERTUSSIS (Whooping Cough) causes severe coughing spells, which can cause difficulty breathing, vomiting and disturbed sleep.  It can also lead to weight loss, incontinence, and rib fractures. Up to 2 in 100 adolescents and 5 in 100 adults with pertussis are hospitalized or have complications, which could include pneumonia and death. These diseases are caused by bacteria. Diphtheria and pertussis are spread from person to person through coughing or sneezing. Tetanus enters the body through cuts, scratches, or wounds. Before vaccines, the [place] saw as many as 200,000 cases a year of diphtheria and pertussis, and hundreds of cases of tetanus. Since vaccination began, tetanus and diphtheria have dropped by about 99% and pertussis by about 80%. TDAP VACCINE Tdap vaccine can protect adolescents and adults from tetanus, diphtheria, and pertussis. One dose of Tdap is routinely given at age 11 or 12. People who did not get Tdap at that age should get it as soon as possible. Tdap is especially important for health care professionals and anyone having close contact with a baby younger than 12 months. Pregnant women should get a dose of Tdap during every pregnancy, to protect the newborn from pertussis. Infants are most at risk for severe, life-threatening  complications from pertussis. A similar vaccine, called Td, protects from tetanus and diphtheria, but not pertussis. A Td booster should be given every 10 years. Tdap may be given as one of these boosters if you have not already gotten a dose. Tdap may also be given after a severe cut or burn to prevent tetanus infection. Your doctor can give you more information. Tdap may safely be given at the same time as other vaccines. SOME PEOPLE SHOULD NOT GET THIS VACCINE  If you ever had a life-threatening allergic reaction after a dose of any tetanus, diphtheria, or pertussis containing vaccine, OR if you have a severe allergy to any part of this vaccine, you should not get Tdap. Tell your doctor if you have any severe allergies.  If you had a coma, or long or multiple seizures within 7 days after a childhood dose of DTP or DTaP, you should not get Tdap, unless a cause other than the vaccine was found. You can still get Td.  Talk to your doctor if you:  have epilepsy or another nervous system problem,  had severe pain or swelling after any vaccine containing diphtheria, tetanus or pertussis,  ever had Guillain-Barr Syndrome (GBS),  aren't feeling well on the day the shot is scheduled. RISKS OF A VACCINE REACTION With any medicine, including vaccines, there is a chance of side effects. These are usually mild and go away on their own, but serious reactions are also possible. Brief fainting spells can follow a vaccination, leading to injuries from falling. Sitting or lying down for about 15 minutes can help prevent these. Tell your doctor if you feel dizzy or light-headed, or   have vision changes or ringing in the ears. Mild problems following Tdap (Did not interfere with activities)  Pain where the shot was given (about 3 in 4 adolescents or 2 in 3 adults)  Redness or swelling where the shot was given (about 1 person in 5)  Mild fever of at least 100.32F (up to about 1 in 25 adolescents or 1 in  100 adults)  Headache (about 3 or 4 people in 10)  Tiredness (about 1 person in 3 or 4)  Nausea, vomiting, diarrhea, stomach ache (up to 1 in 4 adolescents or 1 in 10 adults)  Chills, body aches, sore joints, rash, swollen glands (uncommon) Moderate problems following Tdap (Interfered with activities, but did not require medical attention)  Pain where the shot was given (about 1 in 5 adolescents or 1 in 100 adults)  Redness or swelling where the shot was given (up to about 1 in 16 adolescents or 1 in 25 adults)  Fever over 102F (about 1 in 100 adolescents or 1 in 250 adults)  Headache (about 3 in 20 adolescents or 1 in 10 adults)  Nausea, vomiting, diarrhea, stomach ache (up to 1 or 3 people in 100)  Swelling of the entire arm where the shot was given (up to about 3 in 100). Severe problems following Tdap (Unable to perform usual activities, required medical attention)  Swelling, severe pain, bleeding and redness in the arm where the shot was given (rare). A severe allergic reaction could occur after any vaccine (estimated less than 1 in a million doses). WHAT IF THERE IS A SERIOUS REACTION? What should I look for?  Look for anything that concerns you, such as signs of a severe allergic reaction, very high fever, or behavior changes. Signs of a severe allergic reaction can include hives, swelling of the face and throat, difficulty breathing, a fast heartbeat, dizziness, and weakness. These would start a few minutes to a few hours after the vaccination. What should I do?  If you think it is a severe allergic reaction or other emergency that can't wait, call 9-1-1 or get the person to the nearest hospital. Otherwise, call your doctor.  Afterward, the reaction should be reported to the "Vaccine Adverse Event Reporting System" (VAERS). Your doctor might file this report, or you can do it yourself through the VAERS web site at www.vaers.LAgents.no, or by calling 1-(417)119-9294. VAERS is  only for reporting reactions. They do not give medical advice.  THE NATIONAL VACCINE INJURY COMPENSATION PROGRAM The National Vaccine Injury Compensation Program (VICP) is a federal program that was created to compensate people who may have been injured by certain vaccines. Persons who believe they may have been injured by a vaccine can learn about the program and about filing a claim by calling 1-(214)224-9590 or visiting the VICP website at SpiritualWord.at. HOW CAN I LEARN MORE?  Ask your doctor.  Call your local or state health department.  Contact the Centers for Disease Control and Prevention (CDC): Call 260-583-1882 or visit CDC's website at www.cdc.Breast Self-Awareness Practicing breast self-awareness may pick up problems early, prevent significant medical complications, and possibly save your life. By practicing breast self-awareness, you can become familiar with how your breasts look and feel and if your breasts are changing. This allows you to notice changes early. It can also offer you some reassurance that your breast health is good. One way to learn what is normal for your breasts and whether your breasts are changing is to do a breast self-exam. If  you find a lump or something that was not present in the past, it is best to contact your caregiver right away. Other findings that should be evaluated by your caregiver include nipple discharge, especially if it is bloody; skin changes or reddening; areas where the skin seems to be pulled in (retracted); or new lumps and bumps. Breast pain is seldom associated with cancer (malignancy), but should also be evaluated by a caregiver. HOW TO PERFORM A BREAST SELF-EXAM The best time to examine your breasts is 5 7 days after your menstrual period is over. During menstruation, the breasts are lumpier, and it may be more difficult to pick up changes. If you do not menstruate, have reached menopause, or had your uterus removed  (hysterectomy), you should examine your breasts at regular intervals, such as monthly. If you are breastfeeding, examine your breasts after a feeding or after using a breast pump. Breast implants do not decrease the risk for lumps or tumors, so continue to perform breast self-exams as recommended. Talk to your caregiver about how to determine the difference between the implant and breast tissue. Also, talk about the amount of pressure you should use during the exam. Over time, you will become more familiar with the variations of your breasts and more comfortable with the exam. A breast self-exam requires you to remove all your clothes above the waist. Look at your breasts and nipples. Stand in front of a mirror in a room with good lighting. With your hands on your hips, push your hands firmly downward. Look for a difference in shape, contour, and size from one breast to the other (asymmetry). Asymmetry includes puckers, dips, or bumps. Also, look for skin changes, such as reddened or scaly areas on the breasts. Look for nipple changes, such as discharge, dimpling, repositioning, or redness. Carefully feel your breasts. This is best done either in the shower or tub while using soapy water or when flat on your back. Place the arm (on the side of the breast you are examining) above your head. Use the pads (not the fingertips) of your three middle fingers on your opposite hand to feel your breasts. Start in the underarm area and use  inch (2 cm) overlapping circles to feel your breast. Use 3 different levels of pressure (light, medium, and firm pressure) at each circle before moving to the next circle. The light pressure is needed to feel the tissue closest to the skin. The medium pressure will help to feel breast tissue a little deeper, while the firm pressure is needed to feel the tissue close to the ribs. Continue the overlapping circles, moving downward over the breast until you feel your ribs below your breast.  Then, move one finger-width towards the center of the body. Continue to use the  inch (2 cm) overlapping circles to feel your breast as you move slowly up toward the collar bone (clavicle) near the base of the neck. Continue the up and down exam using all 3 pressures until you reach the middle of the chest. Do this with each breast, carefully feeling for lumps or changes.  Keep a written record with breast changes or normal findings for each breast. By writing this information down, you do not need to depend only on memory for size, tenderness, or location. Write down where you are in your menstrual cycle, if you are still menstruating. Breast tissue can have some lumps or thick tissue. However, see your caregiver if you find anything that concerns you.  SEEK MEDICAL CARE IF: You see a change in shape, contour, or size of your breasts or nipples.  You see skin changes, such as reddened or scaly areas on the breasts or nipples.  You have an unusual discharge from your nipples.  You feel a new lump or unusually thick areas.  Document Released: 01/26/2005 Document Revised: 01/13/2012 Document Reviewed: 05/13/2011 [facility] Patient Information 2014 [place], [place].  gov/vaccines. CDC Tdap Vaccine VIS (06/18/11) Document Released: 07/28/2011 Document Revised: 10/21/2011 Document Reviewed: 07/28/2011 [employer] Patient Information 2014 [place], [place].

## 2012-10-03 NOTE — Progress Notes (Signed)
[name] 04/21/1955 [id number]   History:    57 y.o.  for annual gyn exam With no complaints today. Her endocrinologist has been monitoring her hypothyroidism and adjusting her thyroid medication. She brought her lab work done by her primary physician recently which included comprehensive metabolic panel hemoglobin A1c and lipid profile all normal. Her colonoscopy was in 2013. Patient with no prior history of colon polyps. Her last bone density study was normal in 2013. Patient has not received a Tdap vaccine yet. Patient's mother had history of breast cancer in her 66s but was tested negative for the BRCA1 and BRCA2 gene mutation. Patient's last mammogram was in August of this year. Review of patient's records indicated that she has had resectoscopic uterine polypectomy in 2006 in 2008 respectively and has had endometrial ablation in the past as well.  Past medical history,surgical history, family history and social history were all reviewed and documented in the EPIC chart.  Gynecologic History No LMP recorded. Patient is postmenopausal. Contraception: none Last Pap: 2012. Results were: normal Last mammogram: 2014. Results were: normal  Obstetric History OB History  Gravida Para Term Preterm AB SAB TAB Ectopic Multiple Living  2 2        2     # Outcome Date GA Lbr Len/2nd Weight Sex Delivery Anes PTL Lv  2 PAR           1 PAR                ROS: A ROS was performed and pertinent positives and negatives are included in the history.  GENERAL: No fevers or chills. HEENT: No change in vision, no earache, sore throat or sinus congestion. NECK: No pain or stiffness. CARDIOVASCULAR: No chest pain or pressure. No palpitations. PULMONARY: No shortness of breath, cough or wheeze. GASTROINTESTINAL: No abdominal pain, nausea, vomiting or diarrhea, melena or bright red blood per rectum. GENITOURINARY: No urinary frequency, urgency, hesitancy or dysuria. MUSCULOSKELETAL: No joint or muscle pain,  no back pain, no recent trauma. DERMATOLOGIC: No rash, no itching, no lesions. ENDOCRINE: No polyuria, polydipsia, no heat or cold intolerance. No recent change in weight. HEMATOLOGICAL: No anemia or easy bruising or bleeding. NEUROLOGIC: No headache, seizures, numbness, tingling or weakness. PSYCHIATRIC: No depression, no loss of interest in normal activity or change in sleep pattern.     Exam: chaperone present  BP 124/80  Ht 5' 2.5" (1.588 m)  Wt 258 lb (117.028 kg)  BMI 46.41 kg/m2  Body mass index is 46.41 kg/(m^2).  General appearance : Well developed well nourished female. No acute distress HEENT: Neck supple, trachea midline, no carotid bruits, no thyroidmegaly Lungs: Clear to auscultation, no rhonchi or wheezes, or rib retractions  Heart: Regular rate and rhythm, no murmurs or gallops Breast:Examined in sitting and supine position were symmetrical in appearance, no palpable masses or tenderness,  no skin retraction, no nipple inversion, no nipple discharge, no skin discoloration, no axillary or supraclavicular lymphadenopathy Abdomen: no palpable masses or tenderness, no rebound or guarding Extremities: no edema or skin discoloration or tenderness  Pelvic:  Bartholin, Urethra, Skene Glands: Within normal limits             Vagina: No gross lesions or discharge  Cervix: No gross lesions or discharge  Uterus  anteverted, normal size, shape and consistency, non-tender and mobile  Adnexa  Without masses or tenderness  Anus and perineum  normal   Rectovaginal  normal sphincter tone without palpated masses or tenderness  Hemoccult card provided     Assessment/Plan:  57 y.o. female for annual exam who was reminded of the importance of calcium and vitamin D in regular exercise for osteoporosis prevention. Seizure might also the importance of monthly breast exam. Pap smear not done today in accordance with the new guidelines. She did receive a Tdap vaccine today. Patient's  primary physician will be drawn her lab work. She was given Hemoccult cards assessment to the office for testing.    Ok Edwards MD, 2:08 PM 10/03/2012

## 2012-10-17 ENCOUNTER — Encounter: Payer: Self-pay | Admitting: Gynecology

## 2012-10-20 ENCOUNTER — Other Ambulatory Visit: Payer: BC Managed Care – PPO | Admitting: Anesthesiology

## 2012-10-20 DIAGNOSIS — Z1211 Encounter for screening for malignant neoplasm of colon: Secondary | ICD-10-CM

## 2012-11-14 ENCOUNTER — Telehealth: Payer: Self-pay | Admitting: *Deleted

## 2012-11-14 NOTE — Telephone Encounter (Signed)
Pt informed with stool specimen from 10/20/12.

## 2012-11-21 ENCOUNTER — Ambulatory Visit
Admission: RE | Admit: 2012-11-21 | Discharge: 2012-11-21 | Disposition: A | Discharge status: Home / Self Care | Payer: BC Managed Care – PPO | Source: Ambulatory Visit | Attending: Endocrinology | Admitting: Endocrinology

## 2012-11-21 DIAGNOSIS — E049 Nontoxic goiter, unspecified: Secondary | ICD-10-CM

## 2012-12-01 ENCOUNTER — Other Ambulatory Visit: Payer: Self-pay | Admitting: Endocrinology

## 2012-12-01 DIAGNOSIS — E041 Nontoxic single thyroid nodule: Secondary | ICD-10-CM

## 2012-12-06 ENCOUNTER — Ambulatory Visit
Admission: RE | Admit: 2012-12-06 | Discharge: 2012-12-06 | Disposition: A | Discharge status: Home / Self Care | Payer: BC Managed Care – PPO | Source: Ambulatory Visit | Attending: Endocrinology | Admitting: Endocrinology

## 2012-12-06 ENCOUNTER — Other Ambulatory Visit (HOSPITAL_COMMUNITY)
Admission: RE | Admit: 2012-12-06 | Discharge: 2012-12-06 | Disposition: A | Discharge status: Home / Self Care | Payer: BC Managed Care – PPO | Source: Ambulatory Visit | Attending: Interventional Radiology | Admitting: Interventional Radiology

## 2012-12-06 DIAGNOSIS — E041 Nontoxic single thyroid nodule: Secondary | ICD-10-CM

## 2013-03-06 ENCOUNTER — Other Ambulatory Visit: Payer: Self-pay | Admitting: Endocrinology

## 2013-03-06 DIAGNOSIS — E049 Nontoxic goiter, unspecified: Secondary | ICD-10-CM

## 2013-05-15 ENCOUNTER — Ambulatory Visit
Admission: RE | Admit: 2013-05-15 | Discharge: 2013-05-15 | Disposition: A | Discharge status: Home / Self Care | Payer: BC Managed Care – PPO | Source: Ambulatory Visit | Attending: Endocrinology | Admitting: Endocrinology

## 2013-05-15 DIAGNOSIS — E049 Nontoxic goiter, unspecified: Secondary | ICD-10-CM

## 2013-06-28 ENCOUNTER — Emergency Department (HOSPITAL_COMMUNITY)
Admission: EM | Admit: 2013-06-28 | Discharge: 2013-06-29 | Disposition: A | Discharge status: Home / Self Care | Payer: BC Managed Care – PPO | Attending: Emergency Medicine | Admitting: Emergency Medicine

## 2013-06-28 DIAGNOSIS — R42 Dizziness and giddiness: Secondary | ICD-10-CM | POA: Insufficient documentation

## 2013-06-28 DIAGNOSIS — Z87891 Personal history of nicotine dependence: Secondary | ICD-10-CM | POA: Insufficient documentation

## 2013-06-28 DIAGNOSIS — Z79899 Other long term (current) drug therapy: Secondary | ICD-10-CM | POA: Insufficient documentation

## 2013-06-28 DIAGNOSIS — R112 Nausea with vomiting, unspecified: Secondary | ICD-10-CM | POA: Insufficient documentation

## 2013-06-28 DIAGNOSIS — E669 Obesity, unspecified: Secondary | ICD-10-CM | POA: Insufficient documentation

## 2013-06-28 DIAGNOSIS — R5383 Other fatigue: Secondary | ICD-10-CM

## 2013-06-28 DIAGNOSIS — R5381 Other malaise: Secondary | ICD-10-CM | POA: Insufficient documentation

## 2013-06-28 DIAGNOSIS — Z8742 Personal history of other diseases of the female genital tract: Secondary | ICD-10-CM | POA: Insufficient documentation

## 2013-06-28 DIAGNOSIS — E039 Hypothyroidism, unspecified: Secondary | ICD-10-CM | POA: Insufficient documentation

## 2013-06-28 NOTE — ED Notes (Signed)
Presents with dizziness that began Monday night associated with nausea. Dizziness began when pt moved her head and is worse when lying down or putting head down. Dizziness has gotten worse over the last three days. Pt is alert and oriented, MAE x4. Denies headache.

## 2013-06-29 LAB — CBC
HCT: 41.5 % (ref 36.0–46.0)
Hemoglobin: 13.4 g/dL (ref 12.0–15.0)
MCH: 28.3 pg (ref 26.0–34.0)
MCHC: 32.3 g/dL (ref 30.0–36.0)
MCV: 87.7 fL (ref 78.0–100.0)
Platelets: 229 10*3/uL (ref 150–400)
RBC: 4.73 MIL/uL (ref 3.87–5.11)
RDW: 13 % (ref 11.5–15.5)
WBC: 8.8 10*3/uL (ref 4.0–10.5)

## 2013-06-29 LAB — BASIC METABOLIC PANEL
BUN: 13 mg/dL (ref 6–23)
CO2: 28 mEq/L (ref 19–32)
CREATININE: 0.6 mg/dL (ref 0.50–1.10)
Calcium: 9.2 mg/dL (ref 8.4–10.5)
Chloride: 103 mEq/L (ref 96–112)
Glucose, Bld: 127 mg/dL — ABNORMAL HIGH (ref 70–99)
Potassium: 3.7 mEq/L (ref 3.7–5.3)
Sodium: 143 mEq/L (ref 137–147)

## 2013-06-29 MED ORDER — DIAZEPAM 5 MG/ML IJ SOLN
5.0000 mg | Freq: Once | INTRAMUSCULAR | Status: AC
  Administered 2013-06-29: 5 mg via INTRAVENOUS
  Filled 2013-06-29: qty 2

## 2013-06-29 MED ORDER — ONDANSETRON HCL 4 MG/2ML IJ SOLN
4.0000 mg | Freq: Once | INTRAMUSCULAR | Status: AC
  Administered 2013-06-29: 4 mg via INTRAVENOUS
  Filled 2013-06-29: qty 2

## 2013-06-29 MED ORDER — DIAZEPAM 5 MG PO TABS: 5.0000 mg | ORAL_TABLET | Freq: Four times a day (QID) | ORAL | Status: DC | PRN

## 2013-06-29 MED ORDER — SODIUM CHLORIDE 0.9 % IV BOLUS (SEPSIS)
1000.0000 mL | Freq: Once | INTRAVENOUS | Status: AC
  Administered 2013-06-29: 1000 mL via INTRAVENOUS

## 2013-06-29 MED ORDER — ONDANSETRON 4 MG PO TBDP: 4.0000 mg | ORAL_TABLET | Freq: Four times a day (QID) | ORAL | Status: DC | PRN

## 2013-06-29 NOTE — ED Notes (Signed)
Pt ambulatory with steady gait. Pt denies dizziness or lightheadedness while ambulating.

## 2013-06-29 NOTE — ED Provider Notes (Signed)
CSN: [id number]     Arrival date & time 06/28/13  1911 History   First MD Initiated Contact with Patient 06/28/13 [id number]     Chief Complaint  Patient presents with  . Dizziness     (Consider location/radiation/quality/duration/timing/severity/associated sxs/prior Treatment) HPI 58 year old female presents with intermittent dizziness over the last 2 days. She states it started all of a sudden it occurs with turning her head quickly standing up. Denies headache, chest pain, shortness of breath, or abdominal pain. She does get nauseous when doing this. Denies any tinnitus or ear pain. No recent illness. No diarrhea or urinary symptoms. While at rest she feels normal. She denies any focal weakness but states feels diffusely weak and cold. Denies any actual fevers or chills. Denies any numbness or trouble speaking. No pain. She describes this dizziness as sometimes like a room spinning but other times it is hard to describe. She has trouble walking due to the dizziness.  Past Medical History  Diagnosis Date  . Hypothyroid   . Endometrial hyperplasia 05/2003  . Obesity    Past Surgical History  Procedure Laterality Date  . Thyoidectomy  1996  . Cesarean section    . Breast surgery  2001    BREAST BIOPSY  . Resectoscopic polypectomy  04/2004, 05/2006  . Endometrial ablation  05/2006    RESECTOSCOPIC POLYPECTOMY AND ABLATION   Family History  Problem Relation Age of Onset  . Breast cancer Mother     NEG BRCA1 AND BRCA 2  . Hypertension Father   . Breast cancer Maternal Aunt    History  Substance Use Topics  . Smoking status: Former Research scientist (life sciences)  . Smokeless tobacco: Not on file     Comment: quit 7-8 yrs ago  . Alcohol Use: 0.0 oz/week     Comment: occassionally   OB History   Grav Para Term Preterm Abortions TAB SAB Ect Mult Living   '2 2        2     '$ Review of Systems  Constitutional: Negative for fever.  Respiratory: Negative for shortness of breath.   Cardiovascular: Negative  for chest pain.  Gastrointestinal: Positive for nausea and vomiting. Negative for abdominal pain.  Genitourinary: Negative for dysuria.  Neurological: Positive for dizziness and weakness. Negative for light-headedness and numbness.  All other systems reviewed and are negative.     Allergies  Review of patient's allergies indicates no known allergies.  Home Medications   Prior to Admission medications   Medication Sig Start Date End Date Taking? Authorizing Provider  CALCIUM PO Take by mouth. PT TAKES 1200-1500 DAILY.    Yes Historical Provider, MD  levothyroxine (SYNTHROID, LEVOTHROID) 125 MCG tablet Take 100 mcg by mouth daily.    Yes Historical Provider, MD  Multiple Vitamin (MULTIVITAMIN PO) Take by mouth.     Yes Historical Provider, MD  omega-3 acid ethyl esters (LOVAZA) 1 G capsule Take 2 g by mouth 2 (two) times daily.   Yes Historical Provider, MD  vitamin C (ASCORBIC ACID) 500 MG tablet Take 500 mg by mouth daily.   Yes Historical Provider, MD   BP 140/58  Pulse 70  Temp(Src) 97.9 F (36.6 C) (Oral)  Resp 18  SpO2 100% Physical Exam  Nursing note and vitals reviewed. Constitutional: She is oriented to person, place, and time. She appears well-developed and well-nourished.  HENT:  Head: Normocephalic and atraumatic.  Right Ear: Tympanic membrane and external ear normal.  Left Ear: Tympanic membrane and external ear normal.  Nose: Nose normal.  Eyes: EOM are normal. Pupils are equal, round, and reactive to light. Right eye exhibits no discharge. Left eye exhibits no discharge.  No nystagmus  Neck: Neck supple.  Cardiovascular: Normal rate, regular rhythm and normal heart sounds.   Pulmonary/Chest: Effort normal and breath sounds normal.  Abdominal: Soft. She exhibits no distension. There is no tenderness.  Neurological: She is alert and oriented to person, place, and time. GCS eye subscore is 4. GCS verbal subscore is 5. GCS motor subscore is 6. She displays no  Babinski's sign on the right side. She displays no Babinski's sign on the left side.  Reflex Scores:      Bicep reflexes are 2+ on the right side and 2+ on the left side.      Patellar reflexes are 2+ on the right side and 2+ on the left side.      Achilles reflexes are 2+ on the left side. CN 2-12 grossly intact. 5/5 strength in all 4 extremities. Normal cerebellar testing (Finger to nose, RAM, heel to shin)  Skin: Skin is warm and dry.    ED Course  Procedures (including critical care time) Labs Review Labs Reviewed  BASIC METABOLIC PANEL - Abnormal; Notable for the following:    Glucose, Bld 127 (*)    All other components within normal limits  CBC    Imaging Review No results found.   EKG Interpretation None      MDM   Final diagnoses:  Vertigo    Patient with intermittent dizziness as above. C/w vertigo given the history of symptoms worse with movement and better with rest. No headaches or focal neuro deficits. Improved with valium and zofran and fluids. Able to ambulate without difficulty. Given all this, I feel this is most likely a peripheral vertigo, doubt CVA. Will discharge with symptoms control and PCP f/u.    Ephraim Hamburger, MD 06/29/13 [phone]

## 2013-06-29 NOTE — ED Notes (Signed)
This RN unable to obtain IV access. Earnest Bailey, RN to attempt.

## 2013-06-29 NOTE — Discharge Instructions (Signed)

## 2013-09-22 ENCOUNTER — Encounter: Payer: Self-pay | Admitting: Gynecology

## 2013-10-10 ENCOUNTER — Encounter: Payer: BC Managed Care – PPO | Admitting: Gynecology

## 2013-10-12 ENCOUNTER — Other Ambulatory Visit (HOSPITAL_COMMUNITY)
Admission: RE | Admit: 2013-10-12 | Discharge: 2013-10-12 | Disposition: A | Discharge status: Home / Self Care | Payer: BC Managed Care – PPO | Source: Ambulatory Visit | Attending: Gynecology | Admitting: Gynecology

## 2013-10-12 ENCOUNTER — Encounter: Payer: Self-pay | Admitting: Gynecology

## 2013-10-12 ENCOUNTER — Ambulatory Visit (INDEPENDENT_AMBULATORY_CARE_PROVIDER_SITE_OTHER): Payer: BC Managed Care – PPO | Admitting: Gynecology

## 2013-10-12 VITALS — BP 124/82 | Ht 62.25 in | Wt 255.4 lb

## 2013-10-12 DIAGNOSIS — Z1151 Encounter for screening for human papillomavirus (HPV): Secondary | ICD-10-CM | POA: Insufficient documentation

## 2013-10-12 DIAGNOSIS — E663 Overweight: Secondary | ICD-10-CM

## 2013-10-12 DIAGNOSIS — Z01419 Encounter for gynecological examination (general) (routine) without abnormal findings: Secondary | ICD-10-CM | POA: Diagnosis present

## 2013-10-12 DIAGNOSIS — N942 Vaginismus: Secondary | ICD-10-CM

## 2013-10-12 DIAGNOSIS — N951 Menopausal and female climacteric states: Secondary | ICD-10-CM

## 2013-10-12 DIAGNOSIS — Z780 Asymptomatic menopausal state: Secondary | ICD-10-CM

## 2013-10-12 NOTE — Addendum Note (Signed)
Addended by: Thurnell Garbe A on: 10/12/2013 03:21 PM   Modules accepted: Orders

## 2013-10-12 NOTE — Patient Instructions (Signed)

## 2013-10-12 NOTE — Progress Notes (Signed)
[name] 1955/08/02 [id number]   History:    58 y.o.  for annual gyn exam with no complaints today.Her endocrinologist has been monitoring her hypothyroidism and adjusting her thyroid medication. She brought her lab work done by her primary physician recently which included comprehensive metabolic panel hemoglobin A1c and lipid profile all normal. Her colonoscopy was in 2013. Patient with no prior history of colon polyps. Her last bone density study was normal in 2013. The patient received the Tdap vaccine last year but not interested in flu vaccine.Patient's mother had history of breast cancer in her 24s but was tested negative for the BRCA1 and BRCA2 gene mutation. Patient's last mammogram was in August of this year. Review of patient's records indicated that she has had resectoscopic uterine polypectomy in 2006 in 2008 respectively and has had endometrial ablation in the past as well. Patient denies any vaginal bleeding. Patient not sexually active.   Past medical history,surgical history, family history and social history were all reviewed and documented in the EPIC chart.  Gynecologic History No LMP recorded. Patient is postmenopausal. Contraception: post menopausal status Last Pap: 2012. Results were: normal Last mammogram: 2015. Results were: normal  Obstetric History OB History  Gravida Para Term Preterm AB SAB TAB Ectopic Multiple Living  $Remov'2 2        2    'xBBSvX$ # Outcome Date GA Lbr Len/2nd Weight Sex Delivery Anes PTL Lv  2 PAR           1 PAR                ROS: A ROS was performed and pertinent positives and negatives are included in the history.  GENERAL: No fevers or chills. HEENT: No change in vision, no earache, sore throat or sinus congestion. NECK: No pain or stiffness. CARDIOVASCULAR: No chest pain or pressure. No palpitations. PULMONARY: No shortness of breath, cough or wheeze. GASTROINTESTINAL: No abdominal pain, nausea, vomiting or diarrhea, melena or bright red  blood per rectum. GENITOURINARY: No urinary frequency, urgency, hesitancy or dysuria. MUSCULOSKELETAL: No joint or muscle pain, no back pain, no recent trauma. DERMATOLOGIC: No rash, no itching, no lesions. ENDOCRINE: No polyuria, polydipsia, no heat or cold intolerance. No recent change in weight. HEMATOLOGICAL: No anemia or easy bruising or bleeding. NEUROLOGIC: No headache, seizures, numbness, tingling or weakness. PSYCHIATRIC: No depression, no loss of interest in normal activity or change in sleep pattern.     Exam: chaperone present  BP 124/82  Ht 5' 2.25" (1.581 m)  Wt 255 lb 6.4 oz (115.849 kg)  BMI 46.35 kg/m2  Body mass index is 46.35 kg/(m^2).  General appearance : Well developed well nourished female. No acute distress HEENT: Neck supple, trachea midline, no carotid bruits, no thyroidmegaly Lungs: Clear to auscultation, no rhonchi or wheezes, or rib retractions  Heart: Regular rate and rhythm, no murmurs or gallops Breast:Examined in sitting and supine position were symmetrical in appearance, no palpable masses or tenderness,  no skin retraction, no nipple inversion, no nipple discharge, no skin discoloration, no axillary or supraclavicular lymphadenopathy Abdomen: no palpable masses or tenderness, no rebound or guarding Extremities: no edema or skin discoloration or tenderness  Pelvic:  Bartholin, Urethra, Skene Glands: Within normal limits             Vagina: No gross lesions or discharge  Cervix: No gross lesions or discharge  Uterus: 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 female for annual exam will return to the office later this month for an ultrasound for better assessment of her uterus and adnexa. It was difficult today due to patient's abdominal girth and vaginismus. In the  month of October she will need her bone density study. Patient declined flu vaccine. Patient was reminded of the importance of calcium and vitamin D and regular exercise for osteoporosis prevention. Pap smear was done today. PCP has been doing her blood work.  Note: This dictation was prepared with  Dragon/digital dictation along withSmart phrase technology. Any transcriptional errors that result from this process are unintentional.   Terrance Mass MD, 3:13 PM 10/12/2013

## 2013-10-17 LAB — CYTOLOGY - PAP

## 2013-11-02 ENCOUNTER — Ambulatory Visit (INDEPENDENT_AMBULATORY_CARE_PROVIDER_SITE_OTHER): Payer: BC Managed Care – PPO | Admitting: Gynecology

## 2013-11-02 ENCOUNTER — Ambulatory Visit (INDEPENDENT_AMBULATORY_CARE_PROVIDER_SITE_OTHER): Payer: BC Managed Care – PPO

## 2013-11-02 ENCOUNTER — Other Ambulatory Visit: Payer: Self-pay | Admitting: Gynecology

## 2013-11-02 DIAGNOSIS — Z780 Asymptomatic menopausal state: Secondary | ICD-10-CM

## 2013-11-02 DIAGNOSIS — R9389 Abnormal findings on diagnostic imaging of other specified body structures: Secondary | ICD-10-CM

## 2013-11-02 DIAGNOSIS — D251 Intramural leiomyoma of uterus: Secondary | ICD-10-CM

## 2013-11-02 DIAGNOSIS — N942 Vaginismus: Secondary | ICD-10-CM

## 2013-11-02 DIAGNOSIS — N951 Menopausal and female climacteric states: Secondary | ICD-10-CM

## 2013-11-02 DIAGNOSIS — D259 Leiomyoma of uterus, unspecified: Secondary | ICD-10-CM

## 2013-11-02 DIAGNOSIS — E663 Overweight: Secondary | ICD-10-CM

## 2013-11-02 NOTE — Progress Notes (Signed)
   The patient presented to the office today to discuss her ultrasound which was ordered at the time of her annual exam recently because of her vaginismus and pendulous abdomen and obesity. Patient is on no hormone replacement therapy and denies any vaginal bleeding. Her ultrasound demonstrated the following:  Uterus measured 8.8 x 5.8 x 4.2 cm with endometrial stripe of 7.7 mm she had 2 small fibroid when displacing the endometrium. The endometrium was found to be thickened. A small right ovarian cyst measuring 9 x 7 mm negative flow Doppler. The left ovary not seen and no fluid in the cul-de-sac.  The concern about a thickened endometrium was discussed with the patient in the recommendation was to do an endometrial biopsy today. Endometrial stripe exceeded the 5 mm cut off. Although she is not on hormone replacement therapy but is morbidly obese.  She was counseled for the procedure. The cervix was cleansed with Betadine solution. A single-tooth tenaculum was placed on the anterior cervical lip. A Pipelle was introduced into the uterine cavity. Uterus sounded to 7 cm. After several attempts some tissue was obtained it was submitted for histological evaluation.  Assessment/plan: Postmenopausal patient with incidental finding of thickened endometrium at 7.7 mm. Endometrial biopsy done today results pending at time of this dictation. Patient on no formal replacement therapy but is morbidly obese and is a risk for endometrial hyperplasia or endometrial carcinoma. If the endometrial biopsy is benign we would recommend she follow up in 6 months for ultrasound/sono hysterogram or sooner initially reported vaginal bleeding.

## 2013-11-02 NOTE — Patient Instructions (Signed)
Endometrial Biopsy Endometrial biopsy is a procedure in which a tissue sample is taken from inside the uterus. The tissue sample is then looked at under a microscope to see if the tissue is normal or abnormal. The endometrium is the lining of the uterus. This procedure helps determine where you are in your menstrual cycle and how hormone levels are affecting the lining of the uterus. This procedure may also be used to evaluate uterine bleeding or to diagnose endometrial cancer, tuberculosis, polyps, or inflammatory conditions.  LET YOUR HEALTH CARE PROVIDER KNOW ABOUT:  Any allergies you have.  All medicines you are taking, including vitamins, herbs, eye drops, creams, and over-the-counter medicines.  Previous problems you or members of your family have had with the use of anesthetics.  Any blood disorders you have.  Previous surgeries you have had.  Medical conditions you have.  Possibility of pregnancy. RISKS AND COMPLICATIONS Generally, this is a safe procedure. However, as with any procedure, complications can occur. Possible complications include:  Bleeding.  Pelvic infection.  Puncture of the uterine wall with the biopsy device (rare). BEFORE THE PROCEDURE   Keep a record of your menstrual cycles as directed by your health care provider. You may need to schedule your procedure for a specific time in your cycle.  You may want to bring a sanitary pad to wear home after the procedure.  Arrange for someone to drive you home after the procedure if you will be given a medicine to help you relax (sedative). PROCEDURE   You may be given a sedative to relax you.  You will lie on an exam table with your feet and legs supported as in a pelvic exam.  Your health care provider will insert an instrument (speculum) into your vagina to see your cervix.  Your cervix will be cleansed with an antiseptic solution. A medicine (local anesthetic) will be used to numb the cervix.  A forceps  instrument (tenaculum) will be used to hold your cervix steady for the biopsy.  A thin, rodlike instrument (uterine sound) will be inserted through your cervix to determine the length of your uterus and the location where the biopsy sample will be removed.  A thin, flexible tube (catheter) will be inserted through your cervix and into the uterus. The catheter is used to collect the biopsy sample from your endometrial tissue.  The catheter and speculum will then be removed, and the tissue sample will be sent to a lab for examination. AFTER THE PROCEDURE  You will rest in a recovery area until you are ready to go home.  You may have mild cramping and a small amount of vaginal bleeding for a few days after the procedure. This is normal.  Make sure you find out how to get your test results. Document Released: 05/29/2004 Document Revised: 09/28/2012 Document Reviewed: 07/13/2012 ExitCare Patient Information 2015 [employer]. This information is not intended to replace advice given to you by your health care provider. Make sure you discuss any questions you have with your health care provider.  

## 2013-11-07 ENCOUNTER — Other Ambulatory Visit: Payer: Self-pay | Admitting: Gynecology

## 2013-11-07 DIAGNOSIS — R9389 Abnormal findings on diagnostic imaging of other specified body structures: Secondary | ICD-10-CM

## 2013-11-10 ENCOUNTER — Other Ambulatory Visit: Payer: Self-pay | Admitting: Endocrinology

## 2013-11-10 DIAGNOSIS — E049 Nontoxic goiter, unspecified: Secondary | ICD-10-CM

## 2013-11-13 ENCOUNTER — Other Ambulatory Visit: Payer: Self-pay | Admitting: Gynecology

## 2013-11-13 ENCOUNTER — Ambulatory Visit (INDEPENDENT_AMBULATORY_CARE_PROVIDER_SITE_OTHER): Payer: BC Managed Care – PPO

## 2013-11-13 DIAGNOSIS — Z13820 Encounter for screening for osteoporosis: Secondary | ICD-10-CM

## 2013-11-13 DIAGNOSIS — Z780 Asymptomatic menopausal state: Secondary | ICD-10-CM

## 2013-11-14 ENCOUNTER — Ambulatory Visit
Admission: RE | Admit: 2013-11-14 | Discharge: 2013-11-14 | Disposition: A | Discharge status: Home / Self Care | Payer: BC Managed Care – PPO | Source: Ambulatory Visit | Attending: Endocrinology | Admitting: Endocrinology

## 2013-11-14 DIAGNOSIS — E049 Nontoxic goiter, unspecified: Secondary | ICD-10-CM

## 2013-11-23 ENCOUNTER — Other Ambulatory Visit: Payer: Self-pay | Admitting: Family Medicine

## 2013-11-23 ENCOUNTER — Ambulatory Visit
Admission: RE | Admit: 2013-11-23 | Discharge: 2013-11-23 | Disposition: A | Discharge status: Home / Self Care | Payer: BC Managed Care – PPO | Source: Ambulatory Visit | Attending: Family Medicine | Admitting: Family Medicine

## 2013-11-23 DIAGNOSIS — Z111 Encounter for screening for respiratory tuberculosis: Secondary | ICD-10-CM

## 2013-12-11 ENCOUNTER — Encounter: Payer: Self-pay | Admitting: Gynecology

## 2014-02-09 HISTORY — PX: BREAST SURGERY: SHX581

## 2014-03-12 ENCOUNTER — Other Ambulatory Visit: Payer: Self-pay | Admitting: Endocrinology

## 2014-03-12 DIAGNOSIS — E049 Nontoxic goiter, unspecified: Secondary | ICD-10-CM

## 2014-04-10 ENCOUNTER — Other Ambulatory Visit: Payer: Self-pay | Admitting: Gynecology

## 2014-04-10 DIAGNOSIS — Z780 Asymptomatic menopausal state: Secondary | ICD-10-CM

## 2014-04-10 DIAGNOSIS — R9389 Abnormal findings on diagnostic imaging of other specified body structures: Secondary | ICD-10-CM

## 2014-05-02 ENCOUNTER — Other Ambulatory Visit: Payer: Self-pay | Admitting: Gynecology

## 2014-05-02 ENCOUNTER — Ambulatory Visit (INDEPENDENT_AMBULATORY_CARE_PROVIDER_SITE_OTHER): Payer: BLUE CROSS/BLUE SHIELD

## 2014-05-02 ENCOUNTER — Ambulatory Visit (INDEPENDENT_AMBULATORY_CARE_PROVIDER_SITE_OTHER): Payer: BLUE CROSS/BLUE SHIELD | Admitting: Gynecology

## 2014-05-02 ENCOUNTER — Encounter: Payer: Self-pay | Admitting: Gynecology

## 2014-05-02 DIAGNOSIS — N9489 Other specified conditions associated with female genital organs and menstrual cycle: Secondary | ICD-10-CM

## 2014-05-02 DIAGNOSIS — D251 Intramural leiomyoma of uterus: Secondary | ICD-10-CM | POA: Diagnosis not present

## 2014-05-02 DIAGNOSIS — R938 Abnormal findings on diagnostic imaging of other specified body structures: Secondary | ICD-10-CM

## 2014-05-02 DIAGNOSIS — R934 Abnormal findings on diagnostic imaging of urinary organs: Secondary | ICD-10-CM

## 2014-05-02 DIAGNOSIS — R9389 Abnormal findings on diagnostic imaging of other specified body structures: Secondary | ICD-10-CM

## 2014-05-02 DIAGNOSIS — Z780 Asymptomatic menopausal state: Secondary | ICD-10-CM

## 2014-05-02 NOTE — Progress Notes (Signed)
   Patient presented to the office today for follow-up ultrasound as a result of her thickened endometrium. Her history is as follows:  The patient is obese and pendulous abdomen and difficult pelvic exam and ultrasound had been ordered 11/02/2013 which demonstrated the following:  Uterus measured 8.8 x 5.8 x 4.2 cm with endometrial stripe of 7.7 mm she had 2 small fibroid when displacing the endometrium. The endometrium was found to be thickened. A small right ovarian cyst measuring 9 x 7 mm negative flow Doppler. The left ovary not seen and no fluid in the cul-de-sac.  An endometrial biopsy was done with the following result: Diagnosis Endometrium, biopsy, uterus - BENIGN PROLIFERATIVE ENDOMETRIUM ASSOCIATED WITH A FRAGMENT CONSISTENT WITH POLYP.  Patient was reported no vaginal bleeding and was instructed to follow-up in 6 months for follow-up sonohysterogram.  Ultrasound/sono hysterogram: Uterus measured 8.4 x 6.2 x 4.3 cm with endometrial stripe of 10.7 mm. Patient had several fibroids the largest one measuring 23 x 20 mm. Afebrile right ovarian cyst with internal low level echoes measuring 11 x 11 mm was noted. Left ovary not seen. No fluid in the cul-de-sac. The cervix was cleansed with Betadine solution. A sound 2 tenaculum was placed on the anterior cervical lip. A sterile catheter was introduced into the uterine cavity. Normal saline was instilled into the uterus and a calcified area for at the fundus of the uterus was noted difficulty distending the full cavity. The calcified area borders measured 11 x 9 x 13 mm.  Review of patient's record indicated that she has had resectoscopic uterine polypectomy in 2006 in 2008 respectively and has had endometrial ablation in the past as well..  I will have patient return back to the office after she decides if she wants to proceed with a repeat hysteroscopic evaluation with resectoscopic polypectomy and D&C and wait for results from pathology or  proceed with total abdominal hysterectomy bilateral salpingo-oophorectomy.

## 2014-05-03 ENCOUNTER — Telehealth: Payer: Self-pay | Admitting: *Deleted

## 2014-05-03 NOTE — Telephone Encounter (Signed)
Yes then would be an option. Will have to cancel previous schedule recommendation and need to see her for preop consultation.I will decide after that visit which route: LAVH/BSO  Possible  TAH/BSO

## 2014-05-03 NOTE — Telephone Encounter (Signed)
[name], Cancel the surgery order for the Hysteroscopy D& C and schedule her for LAVH Possible TAH/ BSO. Schedule a preop consult and he will determine at that time which route. Pt knows you will contact her next week. Sharrie Rothman

## 2014-05-03 NOTE — Telephone Encounter (Signed)
Pt called to see if a hysterectomy would be an option for her instead of a Hysteroscopy D &C.Marland Kitchen Please advise KW CMA

## 2014-05-08 ENCOUNTER — Telehealth: Payer: Self-pay

## 2014-05-08 NOTE — Telephone Encounter (Signed)
I spoke with patient and scheduled her for surgery on 06/19/14 at 9:15am.  Pre op consult was scheduled.  I discussed patient's financial responsibility to [facility] and her insurance benefits. Financial letter sent.  Ins benefits form filled out and will be scanned into the Media viewing portion of her chart.

## 2014-05-09 ENCOUNTER — Encounter: Payer: Self-pay | Admitting: Gynecology

## 2014-05-14 NOTE — Telephone Encounter (Signed)
[name] scheduled surgery KW

## 2014-05-16 ENCOUNTER — Ambulatory Visit
Admission: RE | Admit: 2014-05-16 | Discharge: 2014-05-16 | Disposition: A | Discharge status: Home / Self Care | Payer: BLUE CROSS/BLUE SHIELD | Source: Ambulatory Visit | Attending: Endocrinology | Admitting: Endocrinology

## 2014-05-16 DIAGNOSIS — E049 Nontoxic goiter, unspecified: Secondary | ICD-10-CM

## 2014-05-30 ENCOUNTER — Telehealth: Payer: Self-pay

## 2014-05-30 NOTE — Telephone Encounter (Signed)
Patient called because she was considering rescheduling her surgery. We discussed next available date.  She will be traveling and returning a day or two before surgery. She decided she wants to leave it scheduled as is.  We discussed would she be able to go to her bedroom upstairs when she comes home. I told her she could slowly go up and get there but she would need to stay and not be up and down steps.  I suggested she may want to stay of first floor for the first week. She asked would she need to stay in bed first week. I explained that it is good to be up and moving around some and we do not want her to lie in bed, just not up and down steps. She asked would she be able to sit at her computer desk and I assured her as soon as she felt like it that would be fine.

## 2014-06-11 ENCOUNTER — Inpatient Hospital Stay (HOSPITAL_COMMUNITY): Admission: RE | Admit: 2014-06-11 | Payer: Self-pay | Source: Ambulatory Visit

## 2014-06-13 ENCOUNTER — Ambulatory Visit: Payer: BLUE CROSS/BLUE SHIELD | Admitting: Gynecology

## 2014-07-16 ENCOUNTER — Encounter: Payer: Self-pay | Admitting: Gynecology

## 2014-07-16 ENCOUNTER — Ambulatory Visit (INDEPENDENT_AMBULATORY_CARE_PROVIDER_SITE_OTHER): Payer: BLUE CROSS/BLUE SHIELD | Admitting: Gynecology

## 2014-07-16 VITALS — BP 122/82 | Wt 264.4 lb

## 2014-07-16 DIAGNOSIS — D251 Intramural leiomyoma of uterus: Secondary | ICD-10-CM | POA: Diagnosis not present

## 2014-07-16 DIAGNOSIS — Z01818 Encounter for other preprocedural examination: Secondary | ICD-10-CM | POA: Diagnosis not present

## 2014-07-16 DIAGNOSIS — R934 Abnormal findings on diagnostic imaging of urinary organs: Secondary | ICD-10-CM | POA: Diagnosis not present

## 2014-07-16 DIAGNOSIS — N840 Polyp of corpus uteri: Secondary | ICD-10-CM

## 2014-07-16 DIAGNOSIS — R9389 Abnormal findings on diagnostic imaging of other specified body structures: Secondary | ICD-10-CM

## 2014-07-16 MED ORDER — OXYCODONE-ACETAMINOPHEN 7.5-325 MG PO TABS: 1.0000 | ORAL_TABLET | ORAL | Status: AC | PRN

## 2014-07-16 MED ORDER — METOCLOPRAMIDE HCL 10 MG PO TABS: 10.0000 mg | ORAL_TABLET | Freq: Three times a day (TID) | ORAL | Status: DC

## 2014-07-16 MED ORDER — URIBEL 118 MG PO CAPS: ORAL_CAPSULE | ORAL | Status: DC

## 2014-07-16 NOTE — Patient Instructions (Signed)
Laparoscopically Assisted Vaginal Hysterectomy  A laparoscopically assisted vaginal hysterectomy (LAVH) is a surgical procedure to remove the uterus and cervix, and sometimes the ovaries and fallopian tubes. During an LAVH, some of the surgical removal is done through the vagina, and the rest is done through a few small surgical cuts (incisions) in the abdomen.  This procedure is usually considered in women when a vaginal hysterectomy is not an option. Your health care provider will discuss the risks and benefits of the different surgical techniques at your appointment. Generally, recovery time is faster and there are fewer complications after laparoscopic procedures than after open incisional procedures. LET YOUR HEALTH CARE PROVIDER KNOW ABOUT:   Any allergies you have.  All medicines you are taking, including vitamins, herbs, eye drops, creams, and over-the-counter medicines.  Previous problems you or members of your family have had with the use of anesthetics.  Any blood disorders you have.  Previous surgeries you have had.  Medical conditions you have. RISKS AND COMPLICATIONS Generally, this is a safe procedure. However, as with any procedure, complications can occur. Possible complications include:  Allergies to medicines.  Difficulty breathing.  Bleeding.  Infection.  Damage to other structures near your uterus and cervix. BEFORE THE PROCEDURE  Ask your health care provider about changing or stopping your regular medicines.  Take certain medicines, such as a colon-emptying preparation, as directed.  Do not eat or drink anything for at least 8 hours before your surgery.  Stop smoking if you smoke. Stopping will improve your health after surgery.  Arrange for a ride home after surgery and for help at home during recovery. PROCEDURE   An IV tube will be put into one of your veins in order to give you fluids and medicines.  You will receive medicines to relax you and  medicines that make you sleep (general anesthetic).  You may have a flexible tube (catheter) put into your bladder to drain urine.  You may have a tube put through your nose or mouth that goes into your stomach (nasogastric tube). The nasogastric tube removes digestive fluids and prevents you from feeling nauseated and from vomiting.  Tight-fitting (compression) stockings will be placed on your legs to promote circulation.  Three to four small incisions will be made in your abdomen. An incision also will be made in your vagina. Probes and tools will be inserted into the small incisions. The uterus and cervix are removed (and possibly your ovaries and fallopian tubes) through your vagina as well as through the small incisions that were made in the abdomen.  Your vagina is then sewn back to normal. AFTER THE PROCEDURE  You may have a liquid diet temporarily. You will most likely return to, and tolerate, your usual diet the day after surgery.  You will be passing urine through a catheter. It will be removed the day after surgery.  Your temperature, breathing rate, heart rate, blood pressure, and oxygen level will be monitored regularly.  You will still wear compression stockings on your legs until you are able to move around.  You will use a special device or do breathing exercises to keep your lungs clear.  You will be encouraged to walk as soon as possible. Document Released: 01/15/2011 Document Revised: 09/28/2012 Document Reviewed: 08/11/2012 ExitCare Patient Information 2015 [employer]. This information is not intended to replace advice given to you by your health care provider. Make sure you discuss any questions you have with your health care provider.  

## 2014-07-16 NOTE — Progress Notes (Signed)
[name] is an 59 y.o. female. Who presented to the office today for preoperative examination. Patient scheduled to undergo laparoscopic-assisted vaginal hysterectomy along with bilateral salpingo-oophorectomy next week as a result of her thickened endometrium and recurrent endometrial polyps and history of fibroid uterus. Patient early this year had an endometrial biopsy with the following pathology report:  Diagnosis Endometrium, biopsy, uterus - BENIGN PROLIFERATIVE ENDOMETRIUM ASSOCIATED WITH A FRAGMENT CONSISTENT WITH POLYP.  Patient denies any vaginal bleeding.  Patient had an ultrasound/sono hysterogram in March 2016 with the following noted: Uterus measured 8.4 x 6.2 x 4.3 cm with endometrial stripe of 10.7 mm. Patient had several fibroids the largest one measuring 23 x 20 mm. Afebrile right ovarian cyst with internal low level echoes measuring 11 x 11 mm was noted. Left ovary not seen. No fluid in the cul-de-sac. The cervix was cleansed with Betadine solution. A sound 2 tenaculum was placed on the anterior cervical lip. A sterile catheter was introduced into the uterine cavity. Normal saline was instilled into the uterus and a calcified area for at the fundus of the uterus was noted difficulty distending the full cavity. The calcified area borders measured 11 x 9 x 13 mm.   Pertinent Gynecological History: Menses: post-menopausal Bleeding: none Contraception: post menopausal status DES exposure: unknown Blood transfusions: none Sexually transmitted diseases: no past history Previous GYN Procedures: resectoscopic polypectomy, endpmetrial ablation, 1 csection, 1 NSVD  Last mammogram: normal Date: 2015 Last pap: normal Date: 2015 OB History: G2, P2   Menstrual History: Menarche age: 32  No LMP recorded. Patient is postmenopausal.    Past Medical History  Diagnosis Date  . Hypothyroid   . Endometrial hyperplasia 05/2003  . Obesity     Past Surgical History    Procedure Laterality Date  . Thyoidectomy  1996  . Breast surgery  2001    BREAST BIOPSY  . Resectoscopic polypectomy  04/2004, 05/2006  . Endometrial ablation  05/2006    RESECTOSCOPIC POLYPECTOMY AND ABLATION  . Cesarean section      PIH, swolen legs    Family History  Problem Relation Age of Onset  . Breast cancer Mother     NEG BRCA1 AND BRCA 2  . Hypertension Father   . Breast cancer Maternal Aunt     Social History:  reports that she has quit smoking. She does not have any smokeless tobacco history on file. She reports that she drinks alcohol. She reports that she does not use illicit drugs.  Allergies: No Known Allergies   (Not in a hospital admission)  REVIEW OF SYSTEMS: A ROS was performed and pertinent positives and negatives are included in the history.  GENERAL: No fevers or chills. HEENT: No change in vision, no earache, sore throat or sinus congestion. NECK: No pain or stiffness. CARDIOVASCULAR: No chest pain or pressure. No palpitations. PULMONARY: No shortness of breath, cough or wheeze. GASTROINTESTINAL: No abdominal pain, nausea, vomiting or diarrhea, melena or bright red blood per rectum. GENITOURINARY: No urinary frequency, urgency, hesitancy or dysuria. MUSCULOSKELETAL: No joint or muscle pain, no back pain, no recent trauma. DERMATOLOGIC: No rash, no itching, no lesions. ENDOCRINE: No polyuria, polydipsia, no heat or cold intolerance. No recent change in weight. HEMATOLOGICAL: No anemia or easy bruising or bleeding. NEUROLOGIC: No headache, seizures, numbness, tingling or weakness. PSYCHIATRIC: No depression, no loss of interest in normal activity or change in sleep pattern.     Blood pressure 122/82, weight 264 lb 6.4 oz (119.931 kg).  Physical Exam:  HEENT:unremarkable Neck:Supple, midline, no thyroid megaly, no carotid bruits Lungs:  Clear to auscultation no rhonchi's or wheezes Heart:Regular rate and rhythm, no murmurs or gallops Breast Exam:  Symmetrical in appearance no palpable mass or tenderness no supraclavicular axillary lymphadenopathy Abdomen: Slightly pendulous Pfannenstiel scar was noted Pelvic:BUS within normal limits Vagina: No lesions or discharge Cervix: No lesions or discharge Uterus: Anteverted irregular shaped nontender Adnexa: No palpable mass or tenderness Extremities: No cords, no edema Rectal: Unremarkable    Assessment/Plan: Patient scheduled to undergo laparoscopic-assisted vaginal hysterectomy with bilateral salpingo-oophorectomy possible total abdominal hysterectomy with bilateral salpingo-oophorectomy which will be determined when patient is asleep depending on the examination under anesthesia to see if there is room vaginally to complete the operation vaginally. Patient was fully informed of this. Patient was given a prescription of Uribell to take 1 tablet twice a day the day before the surgery as well as the morning of the surgery which will help with the cystoscopy to determine ureteral patency after the procedure is completed. She was also given a prescription of Percocet 7.5/500 to take 1 by mouth every 4-6 hours when necessary postoperative pain as well as Reglan 10 mg 1 by mouth every 4 6 hour when necessary nausea or vomiting. The risks benefits and pros and cons of the operation were discussed with the patient as follows:                        Patient was counseled as to the risk of surgery to include the following:  1. Infection (prohylactic antibiotics will be administered)  2. DVT/Pulmonary Embolism (prophylactic pneumo compression stockings will be used)  3.Trauma to internal organs requiring additional surgical procedure to repair any injury to     Internal organs requiring perhaps additional hospitalization days.  4.Hemmorhage requiring transfusion and blood products which carry risks such as             anaphylactic reaction, hepatitis and AIDS  Patient had received literature  information on the procedure scheduled and all her questions were answered and fully accepts all risk.   [facility] HMD3:33 PMTD@Note :     Terrance Mass 07/16/2014, 2:41 PM  Note: This dictation was prepared with  Dragon/digital dictation along withSmart phrase technology. Any transcriptional errors that result from this process are unintentional.

## 2014-07-18 ENCOUNTER — Other Ambulatory Visit: Payer: Self-pay

## 2014-07-18 ENCOUNTER — Encounter (HOSPITAL_COMMUNITY): Payer: Self-pay

## 2014-07-18 ENCOUNTER — Encounter (HOSPITAL_COMMUNITY)
Admission: RE | Admit: 2014-07-18 | Discharge: 2014-07-18 | Disposition: A | Discharge status: Home / Self Care | Payer: BLUE CROSS/BLUE SHIELD | Source: Ambulatory Visit | Attending: Gynecology | Admitting: Gynecology

## 2014-07-18 DIAGNOSIS — Z01818 Encounter for other preprocedural examination: Secondary | ICD-10-CM | POA: Insufficient documentation

## 2014-07-18 LAB — BASIC METABOLIC PANEL
Anion gap: 6 (ref 5–15)
BUN: 18 mg/dL (ref 6–20)
CO2: 28 mmol/L (ref 22–32)
Calcium: 8.6 mg/dL — ABNORMAL LOW (ref 8.9–10.3)
Chloride: 106 mmol/L (ref 101–111)
Creatinine, Ser: 0.79 mg/dL (ref 0.44–1.00)
GFR calc Af Amer: >60 mL/min (ref >60)
GLUCOSE: 97 mg/dL (ref 65–99)
POTASSIUM: 4.3 mmol/L (ref 3.5–5.1)
Sodium: 140 mmol/L (ref 135–145)

## 2014-07-18 LAB — URINALYSIS, ROUTINE W REFLEX MICROSCOPIC
BILIRUBIN URINE: NEGATIVE
Glucose, UA: NEGATIVE mg/dL
HGB URINE DIPSTICK: NEGATIVE
Ketones, ur: NEGATIVE mg/dL
Leukocytes, UA: NEGATIVE
Nitrite: NEGATIVE
PROTEIN: NEGATIVE mg/dL
SPECIFIC GRAVITY, URINE: 1.01 (ref 1.005–1.030)
UROBILINOGEN UA: 0.2 mg/dL (ref 0.0–1.0)
pH: 6.5 (ref 5.0–8.0)

## 2014-07-18 LAB — CBC
HEMATOCRIT: 42.2 % (ref 36.0–46.0)
Hemoglobin: 13.8 g/dL (ref 12.0–15.0)
MCH: 28.5 pg (ref 26.0–34.0)
MCHC: 32.7 g/dL (ref 30.0–36.0)
MCV: 87 fL (ref 78.0–100.0)
Platelets: 200 10*3/uL (ref 150–400)
RBC: 4.85 MIL/uL (ref 3.87–5.11)
RDW: 13.3 % (ref 11.5–15.5)
WBC: 6.9 10*3/uL (ref 4.0–10.5)

## 2014-07-18 NOTE — Patient Instructions (Addendum)
Your procedure is scheduled on:07/24/14  Enter through the Main Entrance at :6am Pick up desk phone and dial [phone] and inform us of your arrival.  Please call [phone] if you have any problems the morning of surgery.  Remember: Do not eat food or drink liquids, including water, after midnight:Monday   You may brush your teeth the morning of surgery.  Take these meds the morning of surgery with a sip of water:Thyroid pill  DO NOT wear jewelry, eye make-up, lipstick,body lotion, or dark fingernail polish.  (Polished toes are ok) You may wear deodorant.  If you are to be admitted after surgery, leave suitcase in car until your room has been assigned. Patients discharged on the day of surgery will not be allowed to drive home. Wear loose fitting, comfortable clothes for your ride home.

## 2014-07-23 ENCOUNTER — Encounter (HOSPITAL_COMMUNITY): Payer: Self-pay | Admitting: Anesthesiology

## 2014-07-23 MED ORDER — DEXTROSE 5 % IV SOLN
2.0000 g | INTRAVENOUS | Status: AC
  Administered 2014-07-24: 2 g via INTRAVENOUS
  Filled 2014-07-23: qty 2

## 2014-07-23 NOTE — Anesthesia Preprocedure Evaluation (Addendum)
Anesthesia Evaluation  Patient identified by MRN, date of birth, ID band Patient awake    Reviewed: Allergy & Precautions, NPO status , Patient's Chart, lab work & pertinent test results  Airway Mallampati: III  TM Distance: >3 FB Neck ROM: Full    Dental no notable dental hx. (+) Teeth Intact, Caps   Pulmonary former smoker,  breath sounds clear to auscultation  Pulmonary exam normal       Cardiovascular + Peripheral Vascular Disease Normal cardiovascular examRhythm:Regular Rate:Normal     Neuro/Psych negative neurological ROS  negative psych ROS   GI/Hepatic negative GI ROS, Neg liver ROS,   Endo/Other  Hypothyroidism Morbid obesity  Renal/GU negative Renal ROS  negative genitourinary   Musculoskeletal negative musculoskeletal ROS (+)   Abdominal (+) + obese,   Peds  Hematology negative hematology ROS (+)   Anesthesia Other Findings   Reproductive/Obstetrics Thickened Endometrium Endometrial polyp Intramural fibroid                            Anesthesia Physical Anesthesia Plan  ASA: III  Anesthesia Plan: General   Post-op Pain Management:    Induction: Intravenous  Airway Management Planned: Oral ETT  Additional Equipment:   Intra-op Plan:   Post-operative Plan: Extubation in OR  Informed Consent: I have reviewed the patients History and Physical, chart, labs and discussed the procedure including the risks, benefits and alternatives for the proposed anesthesia with the patient or authorized representative who has indicated his/her understanding and acceptance.   Dental advisory given  Plan Discussed with: CRNA, Anesthesiologist and Surgeon  Anesthesia Plan Comments:         Anesthesia Quick Evaluation

## 2014-07-24 ENCOUNTER — Observation Stay (HOSPITAL_COMMUNITY)
Admission: RE | Admit: 2014-07-24 | Discharge: 2014-07-25 | Disposition: A | Discharge status: Home / Self Care | Payer: BLUE CROSS/BLUE SHIELD | Source: Ambulatory Visit | Attending: Gynecology | Admitting: Gynecology

## 2014-07-24 ENCOUNTER — Ambulatory Visit (HOSPITAL_COMMUNITY): Payer: BLUE CROSS/BLUE SHIELD | Admitting: Anesthesiology

## 2014-07-24 ENCOUNTER — Encounter (HOSPITAL_COMMUNITY)
Admission: RE | Disposition: A | Discharge status: Home / Self Care | Payer: Self-pay | Source: Ambulatory Visit | Attending: Gynecology

## 2014-07-24 ENCOUNTER — Encounter (HOSPITAL_COMMUNITY): Payer: Self-pay | Admitting: *Deleted

## 2014-07-24 DIAGNOSIS — D259 Leiomyoma of uterus, unspecified: Secondary | ICD-10-CM | POA: Insufficient documentation

## 2014-07-24 DIAGNOSIS — I739 Peripheral vascular disease, unspecified: Secondary | ICD-10-CM | POA: Insufficient documentation

## 2014-07-24 DIAGNOSIS — Z87891 Personal history of nicotine dependence: Secondary | ICD-10-CM | POA: Insufficient documentation

## 2014-07-24 DIAGNOSIS — Z9889 Other specified postprocedural states: Secondary | ICD-10-CM

## 2014-07-24 DIAGNOSIS — R934 Abnormal findings on diagnostic imaging of urinary organs: Secondary | ICD-10-CM | POA: Diagnosis not present

## 2014-07-24 DIAGNOSIS — R938 Abnormal findings on diagnostic imaging of other specified body structures: Secondary | ICD-10-CM | POA: Diagnosis present

## 2014-07-24 DIAGNOSIS — E039 Hypothyroidism, unspecified: Secondary | ICD-10-CM | POA: Diagnosis not present

## 2014-07-24 DIAGNOSIS — N800 Endometriosis of uterus: Secondary | ICD-10-CM | POA: Insufficient documentation

## 2014-07-24 DIAGNOSIS — N840 Polyp of corpus uteri: Secondary | ICD-10-CM | POA: Diagnosis not present

## 2014-07-24 HISTORY — PX: LAPAROSCOPIC ASSISTED VAGINAL HYSTERECTOMY: SHX5398

## 2014-07-24 HISTORY — PX: LAPAROSCOPIC SALPINGO OOPHERECTOMY: SHX5927

## 2014-07-24 LAB — PREGNANCY, URINE: Preg Test, Ur: NEGATIVE

## 2014-07-24 SURGERY — HYSTERECTOMY, VAGINAL, LAPAROSCOPY-ASSISTED
Anesthesia: General

## 2014-07-24 MED ORDER — ONDANSETRON HCL 4 MG/2ML IJ SOLN
INTRAMUSCULAR | Status: AC
  Filled 2014-07-24: qty 2

## 2014-07-24 MED ORDER — PHENYLEPHRINE HCL 10 MG/ML IJ SOLN
INTRAMUSCULAR | Status: DC | PRN
  Administered 2014-07-24 (×3): 80 ug via INTRAVENOUS
  Administered 2014-07-24: 40 ug via INTRAVENOUS

## 2014-07-24 MED ORDER — SCOPOLAMINE 1 MG/3DAYS TD PT72
MEDICATED_PATCH | TRANSDERMAL | Status: AC
  Filled 2014-07-24: qty 1

## 2014-07-24 MED ORDER — HYDROMORPHONE HCL 1 MG/ML IJ SOLN: 0.2500 mg | INTRAMUSCULAR | Status: DC | PRN

## 2014-07-24 MED ORDER — PROPOFOL 10 MG/ML IV BOLUS
INTRAVENOUS | Status: AC
  Filled 2014-07-24: qty 20

## 2014-07-24 MED ORDER — SODIUM CHLORIDE 0.9 % IJ SOLN: 9.0000 mL | INTRAMUSCULAR | Status: DC | PRN

## 2014-07-24 MED ORDER — ONDANSETRON HCL 4 MG/2ML IJ SOLN
INTRAMUSCULAR | Status: DC | PRN
  Administered 2014-07-24: 4 mg via INTRAVENOUS

## 2014-07-24 MED ORDER — NEOSTIGMINE METHYLSULFATE 10 MG/10ML IV SOLN
INTRAVENOUS | Status: DC | PRN
  Administered 2014-07-24: 4 mg via INTRAVENOUS

## 2014-07-24 MED ORDER — ONDANSETRON HCL 4 MG/2ML IJ SOLN: 4.0000 mg | Freq: Four times a day (QID) | INTRAMUSCULAR | Status: DC | PRN

## 2014-07-24 MED ORDER — FENTANYL CITRATE (PF) 100 MCG/2ML IJ SOLN
INTRAMUSCULAR | Status: DC | PRN
  Administered 2014-07-24: 100 ug via INTRAVENOUS
  Administered 2014-07-24 (×2): 50 ug via INTRAVENOUS
  Administered 2014-07-24: 150 ug via INTRAVENOUS

## 2014-07-24 MED ORDER — MIDAZOLAM HCL 2 MG/2ML IJ SOLN
INTRAMUSCULAR | Status: AC
  Filled 2014-07-24: qty 2

## 2014-07-24 MED ORDER — LIDOCAINE-EPINEPHRINE 1 %-1:100000 IJ SOLN
INTRAMUSCULAR | Status: DC | PRN
  Administered 2014-07-24: 20 mL

## 2014-07-24 MED ORDER — HYDROMORPHONE HCL 1 MG/ML IJ SOLN
INTRAMUSCULAR | Status: AC
  Filled 2014-07-24: qty 1

## 2014-07-24 MED ORDER — FENTANYL CITRATE (PF) 250 MCG/5ML IJ SOLN
INTRAMUSCULAR | Status: AC
  Filled 2014-07-24: qty 5

## 2014-07-24 MED ORDER — LIDOCAINE-EPINEPHRINE 1 %-1:100000 IJ SOLN
INTRAMUSCULAR | Status: AC
  Filled 2014-07-24: qty 1

## 2014-07-24 MED ORDER — SCOPOLAMINE 1 MG/3DAYS TD PT72
1.0000 | MEDICATED_PATCH | Freq: Once | TRANSDERMAL | Status: DC
  Administered 2014-07-24: 1.5 mg via TRANSDERMAL

## 2014-07-24 MED ORDER — PROPOFOL 10 MG/ML IV BOLUS
INTRAVENOUS | Status: DC | PRN
  Administered 2014-07-24: 200 mg via INTRAVENOUS

## 2014-07-24 MED ORDER — ROCURONIUM BROMIDE 100 MG/10ML IV SOLN
INTRAVENOUS | Status: AC
  Filled 2014-07-24: qty 1

## 2014-07-24 MED ORDER — 0.9 % SODIUM CHLORIDE (POUR BTL) OPTIME
TOPICAL | Status: DC | PRN
  Administered 2014-07-24: 1000 mL

## 2014-07-24 MED ORDER — PROMETHAZINE HCL 25 MG/ML IJ SOLN
6.2500 mg | INTRAMUSCULAR | Status: DC | PRN
Start: 2014-07-24 — End: 2014-07-24

## 2014-07-24 MED ORDER — LIDOCAINE HCL (CARDIAC) 20 MG/ML IV SOLN
INTRAVENOUS | Status: DC | PRN
  Administered 2014-07-24: 100 mg via INTRAVENOUS

## 2014-07-24 MED ORDER — NALOXONE HCL 0.4 MG/ML IJ SOLN: 0.4000 mg | INTRAMUSCULAR | Status: DC | PRN

## 2014-07-24 MED ORDER — BUPIVACAINE HCL (PF) 0.25 % IJ SOLN
INTRAMUSCULAR | Status: DC | PRN
  Administered 2014-07-24: 30 mL

## 2014-07-24 MED ORDER — LACTATED RINGERS IV SOLN
INTRAVENOUS | Status: DC
  Administered 2014-07-24 (×2): via INTRAVENOUS

## 2014-07-24 MED ORDER — HYDROMORPHONE HCL 1 MG/ML IJ SOLN
INTRAMUSCULAR | Status: DC | PRN
  Administered 2014-07-24: 1 mg via INTRAVENOUS

## 2014-07-24 MED ORDER — LACTATED RINGERS IR SOLN
Status: DC | PRN
  Administered 2014-07-24: 3000 mL

## 2014-07-24 MED ORDER — FENTANYL CITRATE (PF) 100 MCG/2ML IJ SOLN
INTRAMUSCULAR | Status: AC
  Filled 2014-07-24: qty 2

## 2014-07-24 MED ORDER — LEVOTHYROXINE SODIUM 100 MCG PO TABS
100.0000 ug | ORAL_TABLET | Freq: Every day | ORAL | Status: DC
  Administered 2014-07-25: 100 ug via ORAL
  Filled 2014-07-24: qty 1

## 2014-07-24 MED ORDER — DIPHENHYDRAMINE HCL 12.5 MG/5ML PO ELIX
12.5000 mg | ORAL_SOLUTION | Freq: Four times a day (QID) | ORAL | Status: DC | PRN
  Filled 2014-07-24: qty 5

## 2014-07-24 MED ORDER — OXYCODONE-ACETAMINOPHEN 5-325 MG PO TABS: 1.0000 | ORAL_TABLET | Freq: Four times a day (QID) | ORAL | Status: DC | PRN

## 2014-07-24 MED ORDER — DIPHENHYDRAMINE HCL 50 MG/ML IJ SOLN: 12.5000 mg | Freq: Four times a day (QID) | INTRAMUSCULAR | Status: DC | PRN

## 2014-07-24 MED ORDER — BUPIVACAINE HCL (PF) 0.25 % IJ SOLN
INTRAMUSCULAR | Status: AC
  Filled 2014-07-24: qty 30

## 2014-07-24 MED ORDER — BUPIVACAINE LIPOSOME 1.3 % IJ SUSP
20.0000 mL | Freq: Once | INTRAMUSCULAR | Status: AC
  Administered 2014-07-24: 20 mL
  Filled 2014-07-24: qty 20

## 2014-07-24 MED ORDER — LACTATED RINGERS IV SOLN
INTRAVENOUS | Status: DC
  Administered 2014-07-24 (×3): via INTRAVENOUS

## 2014-07-24 MED ORDER — STERILE WATER FOR IRRIGATION IR SOLN
Status: DC | PRN
  Administered 2014-07-24: 1000 mL

## 2014-07-24 MED ORDER — MIDAZOLAM HCL 2 MG/2ML IJ SOLN
INTRAMUSCULAR | Status: DC | PRN
  Administered 2014-07-24: 2 mg via INTRAVENOUS

## 2014-07-24 MED ORDER — KETOROLAC TROMETHAMINE 30 MG/ML IJ SOLN: 30.0000 mg | Freq: Once | INTRAMUSCULAR | Status: DC | PRN

## 2014-07-24 MED ORDER — LIDOCAINE HCL (CARDIAC) 20 MG/ML IV SOLN
INTRAVENOUS | Status: AC
  Filled 2014-07-24: qty 5

## 2014-07-24 MED ORDER — MEPERIDINE HCL 25 MG/ML IJ SOLN: 6.2500 mg | INTRAMUSCULAR | Status: DC | PRN

## 2014-07-24 MED ORDER — ROCURONIUM BROMIDE 100 MG/10ML IV SOLN
INTRAVENOUS | Status: DC | PRN
  Administered 2014-07-24 (×2): 10 mg via INTRAVENOUS
  Administered 2014-07-24: 50 mg via INTRAVENOUS
  Administered 2014-07-24: 10 mg via INTRAVENOUS

## 2014-07-24 MED ORDER — MORPHINE SULFATE 1 MG/ML IV SOLN
INTRAVENOUS | Status: DC
  Administered 2014-07-24: 12:00:00 via INTRAVENOUS
  Filled 2014-07-24: qty 25

## 2014-07-24 MED ORDER — DEXAMETHASONE SODIUM PHOSPHATE 10 MG/ML IJ SOLN
INTRAMUSCULAR | Status: DC | PRN
  Administered 2014-07-24: 4 mg via INTRAVENOUS

## 2014-07-24 MED ORDER — DEXAMETHASONE SODIUM PHOSPHATE 4 MG/ML IJ SOLN
INTRAMUSCULAR | Status: AC
  Filled 2014-07-24: qty 1

## 2014-07-24 MED ORDER — GLYCOPYRROLATE 0.2 MG/ML IJ SOLN
INTRAMUSCULAR | Status: DC | PRN
  Administered 2014-07-24: 0.6 mg via INTRAVENOUS

## 2014-07-24 SURGICAL SUPPLY — 79 items
BARRIER ADHS 3X4 INTERCEED (GAUZE/BANDAGES/DRESSINGS) IMPLANT
CABLE HIGH FREQUENCY MONO STRZ (ELECTRODE) ×3 IMPLANT
CANISTER SUCT 3000ML (MISCELLANEOUS) ×3 IMPLANT
CLOTH BEACON ORANGE TIMEOUT ST (SAFETY) ×3 IMPLANT
CONT PATH 16OZ SNAP LID 3702 (MISCELLANEOUS) ×3 IMPLANT
COVER BACK TABLE 60X90IN (DRAPES) ×3 IMPLANT
COVER MAYO STAND STRL (DRAPES) ×3 IMPLANT
DECANTER SPIKE VIAL GLASS SM (MISCELLANEOUS) ×6 IMPLANT
DRAPE CESAREAN BIRTH W POUCH (DRAPES) ×3 IMPLANT
DRAPE WARM FLUID 44X44 (DRAPE) IMPLANT
DRSG COVADERM PLUS 2X2 (GAUZE/BANDAGES/DRESSINGS) ×6 IMPLANT
DRSG OPSITE POSTOP 3X4 (GAUZE/BANDAGES/DRESSINGS) IMPLANT
DRSG OPSITE POSTOP 4X10 (GAUZE/BANDAGES/DRESSINGS) ×3 IMPLANT
DRSG TEGADERM 2.38X2.75 (GAUZE/BANDAGES/DRESSINGS) IMPLANT
DRSG XEROFORM 1X8 (GAUZE/BANDAGES/DRESSINGS) ×3 IMPLANT
DURAPREP 26ML APPLICATOR (WOUND CARE) ×3 IMPLANT
ELECT REM PT RETURN 9FT ADLT (ELECTROSURGICAL) ×3
ELECTRODE REM PT RTRN 9FT ADLT (ELECTROSURGICAL) ×2 IMPLANT
EVACUATOR SMOKE 8.L (FILTER) ×3 IMPLANT
FILTER SMOKE EVAC LAPAROSHD (FILTER) ×3 IMPLANT
GLOVE BIOGEL PI IND STRL 7.0 (GLOVE) ×4 IMPLANT
GLOVE BIOGEL PI IND STRL 8 (GLOVE) ×2 IMPLANT
GLOVE BIOGEL PI INDICATOR 7.0 (GLOVE) ×2
GLOVE BIOGEL PI INDICATOR 8 (GLOVE) ×1
GLOVE ECLIPSE 7.5 STRL STRAW (GLOVE) ×6 IMPLANT
GOWN STRL REUS W/TWL LRG LVL3 (GOWN DISPOSABLE) ×9 IMPLANT
LIQUID BAND (GAUZE/BANDAGES/DRESSINGS) IMPLANT
NEEDLE HYPO 22GX1.5 SAFETY (NEEDLE) IMPLANT
NS IRRIG 1000ML POUR BTL (IV SOLUTION) ×3 IMPLANT
PACK ABDOMINAL GYN (CUSTOM PROCEDURE TRAY) ×3 IMPLANT
PACK LAPAROSCOPY BASIN (CUSTOM PROCEDURE TRAY) ×3 IMPLANT
PACK LAVH (CUSTOM PROCEDURE TRAY) ×3 IMPLANT
PACK ROBOTIC GOWN (GOWN DISPOSABLE) ×3 IMPLANT
PAD OB MATERNITY 4.3X12.25 (PERSONAL CARE ITEMS) ×3 IMPLANT
PAD POSITIONER PINK NONSTERILE (MISCELLANEOUS) ×3 IMPLANT
POUCH SPECIMEN RETRIEVAL 10MM (ENDOMECHANICALS) ×3 IMPLANT
PROTECTOR NERVE ULNAR (MISCELLANEOUS) ×3 IMPLANT
RETRACTOR WND ALEXIS 25 LRG (MISCELLANEOUS) IMPLANT
RTRCTR WOUND ALEXIS 25CM LRG (MISCELLANEOUS)
SET CYSTO W/LG BORE CLAMP LF (SET/KITS/TRAYS/PACK) IMPLANT
SET IRRIG TUBING LAPAROSCOPIC (IRRIGATION / IRRIGATOR) ×3 IMPLANT
SHEARS HARMONIC ACE PLUS 36CM (ENDOMECHANICALS) ×3 IMPLANT
SLEEVE XCEL OPT CAN 5 100 (ENDOMECHANICALS) ×3 IMPLANT
SOLUTION ELECTROLUBE (MISCELLANEOUS) ×3 IMPLANT
SPONGE GAUZE 4X4 12PLY STER LF (GAUZE/BANDAGES/DRESSINGS) ×6 IMPLANT
SPONGE LAP 18X18 X RAY DECT (DISPOSABLE) ×6 IMPLANT
STAPLER VISISTAT 35W (STAPLE) ×3 IMPLANT
STRIP CLOSURE SKIN 1/2X4 (GAUZE/BANDAGES/DRESSINGS) IMPLANT
STRIP CLOSURE SKIN 1/4X3 (GAUZE/BANDAGES/DRESSINGS) IMPLANT
SUT CHROMIC 3 0 SH 27 (SUTURE) IMPLANT
SUT VIC AB 0 CT1 18XCR BRD8 (SUTURE) ×6 IMPLANT
SUT VIC AB 0 CT1 27 (SUTURE)
SUT VIC AB 0 CT1 27XBRD ANBCTR (SUTURE) IMPLANT
SUT VIC AB 0 CT1 36 (SUTURE) ×3 IMPLANT
SUT VIC AB 0 CT1 8-18 (SUTURE) ×3
SUT VIC AB 1 CT1 18XBRD ANBCTR (SUTURE) IMPLANT
SUT VIC AB 1 CT1 8-18 (SUTURE)
SUT VIC AB 2-0 SH 27 (SUTURE)
SUT VIC AB 2-0 SH 27XBRD (SUTURE) IMPLANT
SUT VIC AB 3-0 CT1 27 (SUTURE) ×2
SUT VIC AB 3-0 CT1 TAPERPNT 27 (SUTURE) ×4 IMPLANT
SUT VIC AB 3-0 PS2 18 (SUTURE) ×1
SUT VIC AB 3-0 PS2 18XBRD (SUTURE) ×2 IMPLANT
SUT VIC AB 3-0 SH 27 (SUTURE) ×1
SUT VIC AB 3-0 SH 27X BRD (SUTURE) ×2 IMPLANT
SUT VICRYL 0 TIES 12 18 (SUTURE) ×3 IMPLANT
SUT VICRYL 0 UR6 27IN ABS (SUTURE) ×3 IMPLANT
SUT VICRYL 3 0 BR 18  UND (SUTURE)
SUT VICRYL 3 0 BR 18 UND (SUTURE) IMPLANT
SUT VICRYL RAPIDE 3 0 (SUTURE) ×6 IMPLANT
SYR BULB IRRIGATION 50ML (SYRINGE) ×3 IMPLANT
SYR CONTROL 10ML LL (SYRINGE) IMPLANT
TOWEL OR 17X24 6PK STRL BLUE (TOWEL DISPOSABLE) ×6 IMPLANT
TRAY FOLEY CATH SILVER 14FR (SET/KITS/TRAYS/PACK) ×3 IMPLANT
TROCAR BALLN 12MMX100 BLUNT (TROCAR) IMPLANT
TROCAR XCEL NON-BLD 11X100MML (ENDOMECHANICALS) ×3 IMPLANT
TROCAR XCEL NON-BLD 5MMX100MML (ENDOMECHANICALS) ×3 IMPLANT
WARMER LAPAROSCOPE (MISCELLANEOUS) ×3 IMPLANT
WATER STERILE IRR 1000ML POUR (IV SOLUTION) ×3 IMPLANT

## 2014-07-24 NOTE — Interval H&P Note (Signed)
History and Physical Interval Note:  07/24/2014 7:00 AM  [name]  has presented today for surgery, with the diagnosis of thickened endometrium, endometrial polyp  The various methods of treatment have been discussed with the patient and family. After consideration of risks, benefits and other options for treatment, the patient has consented to  Procedure(s): LAPAROSCOPIC ASSISTED VAGINAL HYSTERECTOMY (N/A) POSSIBLE LAPAROSCOPIC SALPINGO OOPHORECTOMY (Bilateral) POSSIBLE HYSTERECTOMY ABDOMINAL (N/A) POSSIBLE SALPINGO OOPHORECTOMY (Bilateral) as a surgical intervention .  The patient's history has been reviewed, patient examined, no change in status, stable for surgery.  I have reviewed the patient's chart and labs.  Questions were answered to the patient's satisfaction.     Terrance Mass

## 2014-07-24 NOTE — Op Note (Signed)
Operative Note  07/24/2014  1:26 PM  PATIENT:  [name]  59 y.o. female  PRE-OPERATIVE DIAGNOSIS:  thickened endometrium, endometrial polyp  POST-OPERATIVE DIAGNOSIS:  thickened endometrium, endometrial polyp  PROCEDURE:  Procedure(s): LAPAROSCOPIC ASSISTED VAGINAL HYSTERECTOMY WITH CYSTOSCOPY LAPAROSCOPIC SALPINGO OOPHORECTOMY, cystoscopy  SURGEON:  Surgeon(s): Terrance Mass, MD Anastasio Auerbach, MD  ANESTHESIA:   general  FINDINGS: Thick adhesion noted lower uterine segment near bladder midline. Normal-appearing tubes and ovary. Appendix not seen. Smooth liver surface and gallbladder   DESCRIPTION OF OPERATION:After adequate general endotracheal anesthesia, the patient was placed in dorsal lithotomy position, prepped and draped in the usual manner for a laparoscopic procedure. Patient received her prophylactic antibiotic and had PAS stockings in place as well.  A time out was undertaken for proper identification of the patient and procedure to be performed. A speculum was placed into the vagina. A bimanual exam was performed and then then  A single tooth tenaculum was utilized to grasp the anterior lip of the uterine cervix. The uterus was sounded to  6-1/2 cm The single-tooth tenaculum and speculum were removed from the vagina. A foley catheter was inserted to monitor urinary output.  At this time, an infraumbilical  skin incision was made through which a 10/11-mm  Opti-View trocar was introduced through the infraumbilical incision into the abdominal cavity. A pneumoperitoneum was established  With CO2 for approximately 2 1/2 liters. Through the trocar sheath, the laparoscope was inserted and adequate visualization of the pelvic structures was noted. Two  5-mm skin incision were made on the patients right and left lower abdomin under laparoscopic guidance and  two  5-mm trocars were  introduced into the abdominal cavity for instrumentation. Evaluation of the pelvis revealed a  thick adhesive band lower uterine segment adhered to the anterior abdominal wall. Otherwise normal-appearing tubes and ovary. The ureters were noted to be deep in the pelvis. At this time, after identifying the right ureter the right infundibulopelvic ligament on the right was coaptated and transected with Harmonic scalpel leaving the right ovary behind. With a Harmonic Scalpel the remainder of the uterine vessels and anterior and posterior leaves of the broad ligament, as well as the cardinal ligaments were  transected in a serial fashion down to level of the uterine artery.  A similar procedure was carried out on the left with the left utero-ovarian ligament coaptated and transected with the Harmonic scalpel to leave the left ovary behind.. The remainder of the cardinal ligament, uterine vessels, anterior, and posterior sheaths of the broad ligament were coaptated  and transected with the Harmonic Scalpel  in a serial manner to the level of the uterine artery.  The anterior leaf of the broad ligament was then dissected to the midline bilaterally, establishing a bladder flap with a combination of blunt and sharp dissection. At this time, attention was made to the vaginal hysterectomy. The laparoscope was removed and attention was made to the vaginal hysterectomy.  Hulka Tenaculum  was removed and the anterior and posterior leafs of the cervix were grasped with Lahey tenaculum. A circumferential injection with  1% Lidocaine with 1:100,000  Epinephrine dilution  Was injected into  the cervicovaginal portio for 20 cc's. . A circumferential incision was then made at the cervicovaginal portio. The anterior and posterior colpotomies were accomplished with a combination of blunt and sharp dissection without difficulty. The right uterosacral ligament was clamped, transected, and ligated with #0 Vicryl sutures. The left uterosacral ligament was clamped, transected, and ligated  with #0 Vicryl suture. The parametrial tissue  was then clamped bilaterally, transected, and ligated with #0 Vicryl suture bilaterally. The uterus with cervix and bilateral tubes and ovaries were  then removed and passed off the operative field. Laparotomy pack was placed into the pelvis. The pedicles were evaluated. There was no bleeding noted; therefore, the laparotomy pack was removed. The uterosacral ligaments were suture fixated into the vaginal cuff angles with #0 Vicryl sutures. The vaginal cuff was then closed Hemostasis was noted throughout. The posterior peritoneum was secured to the vagina with a running stitch of 0-Vicryl from 3-9 o'clock. The vaginal cuff was then closed with interrupted figure of eights with 0-Vicryl. At this time to confirm ureteral patency a 70 cystoscope was introduced into the bladder with normal saline to distend the bladder. Both ureteral orifice were identified and reflux of urine was noted. The remainder of the bladder mucosa was intact. At this time, the laparoscope was reinserted into the abdomen. The abdomen was reinsufflated. Evaluation revealed no further bleeding. Irrigation with sterile water was performed and again no bleeding was noted. The  5 mm trocar sheaths  were then removed under laparoscopic visualization. The laparoscope was removed. The carbon dioxide was allowed to escape from the abdomen and the infraumbilical trocar sheath was then removed. The skin incisions were closed with 0-vicryl at the subumbilical incision site and the skin as well as the 5 mm ports were reapproximated with Dermabon Glue. Neosporin and small dressings applied. There were no complications. The instrument, sponge, and needle counts were correct. PAtient was extubated and transferred to recovery room.    ESTIMATED BLOOD LOSS: 300 cc  Intake/Output Summary (Last 24 hours) at 07/24/14 1326 Last data filed at 07/24/14 1100  Gross per 24 hour  Intake   2600 ml  Output    950 ml  Net   1650 ml     BLOOD ADMINISTERED:none    LOCAL MEDICATIONS USED:  MARCAINE 0.25% at all 3 incision ports for a total of 10 cc. Also paracervical block with 1% lidocaine with 1 100,000 epinephrine for 20 cc    SPECIMEN:  Source of Specimen:  Uterus, cervix, bilateral tubes and ovary  DISPOSITION OF SPECIMEN:  PATHOLOGY  COUNTS:  YES  PLAN OF CARE: Transfer to [place] HMD1:26 PMTD@

## 2014-07-24 NOTE — H&P (View-Only) (Signed)
[name] is an 59 y.o. female. Who presented to the office today for preoperative examination. Patient scheduled to undergo laparoscopic-assisted vaginal hysterectomy along with bilateral salpingo-oophorectomy next week as a result of her thickened endometrium and recurrent endometrial polyps and history of fibroid uterus. Patient early this year had an endometrial biopsy with the following pathology report:  Diagnosis Endometrium, biopsy, uterus - BENIGN PROLIFERATIVE ENDOMETRIUM ASSOCIATED WITH A FRAGMENT CONSISTENT WITH POLYP.  Patient denies any vaginal bleeding.  Patient had an ultrasound/sono hysterogram in March 2016 with the following noted: Uterus measured 8.4 x 6.2 x 4.3 cm with endometrial stripe of 10.7 mm. Patient had several fibroids the largest one measuring 23 x 20 mm. Afebrile right ovarian cyst with internal low level echoes measuring 11 x 11 mm was noted. Left ovary not seen. No fluid in the cul-de-sac. The cervix was cleansed with Betadine solution. A sound 2 tenaculum was placed on the anterior cervical lip. A sterile catheter was introduced into the uterine cavity. Normal saline was instilled into the uterus and a calcified area for at the fundus of the uterus was noted difficulty distending the full cavity. The calcified area borders measured 11 x 9 x 13 mm.   Pertinent Gynecological History: Menses: post-menopausal Bleeding: none Contraception: post menopausal status DES exposure: unknown Blood transfusions: none Sexually transmitted diseases: no past history Previous GYN Procedures: resectoscopic polypectomy, endpmetrial ablation, 1 csection, 1 NSVD  Last mammogram: normal Date: 2015 Last pap: normal Date: 2015 OB History: G2, P2   Menstrual History: Menarche age: 89  No LMP recorded. Patient is postmenopausal.    Past Medical History  Diagnosis Date  . Hypothyroid   . Endometrial hyperplasia 05/2003  . Obesity     Past Surgical History    Procedure Laterality Date  . Thyoidectomy  1996  . Breast surgery  2001    BREAST BIOPSY  . Resectoscopic polypectomy  04/2004, 05/2006  . Endometrial ablation  05/2006    RESECTOSCOPIC POLYPECTOMY AND ABLATION  . Cesarean section      PIH, swolen legs    Family History  Problem Relation Age of Onset  . Breast cancer Mother     NEG BRCA1 AND BRCA 2  . Hypertension Father   . Breast cancer Maternal Aunt     Social History:  reports that she has quit smoking. She does not have any smokeless tobacco history on file. She reports that she drinks alcohol. She reports that she does not use illicit drugs.  Allergies: No Known Allergies   (Not in a hospital admission)  REVIEW OF SYSTEMS: A ROS was performed and pertinent positives and negatives are included in the history.  GENERAL: No fevers or chills. HEENT: No change in vision, no earache, sore throat or sinus congestion. NECK: No pain or stiffness. CARDIOVASCULAR: No chest pain or pressure. No palpitations. PULMONARY: No shortness of breath, cough or wheeze. GASTROINTESTINAL: No abdominal pain, nausea, vomiting or diarrhea, melena or bright red blood per rectum. GENITOURINARY: No urinary frequency, urgency, hesitancy or dysuria. MUSCULOSKELETAL: No joint or muscle pain, no back pain, no recent trauma. DERMATOLOGIC: No rash, no itching, no lesions. ENDOCRINE: No polyuria, polydipsia, no heat or cold intolerance. No recent change in weight. HEMATOLOGICAL: No anemia or easy bruising or bleeding. NEUROLOGIC: No headache, seizures, numbness, tingling or weakness. PSYCHIATRIC: No depression, no loss of interest in normal activity or change in sleep pattern.     Blood pressure 122/82, weight 264 lb 6.4 oz (119.931 kg).  Physical Exam:  HEENT:unremarkable Neck:Supple, midline, no thyroid megaly, no carotid bruits Lungs:  Clear to auscultation no rhonchi's or wheezes Heart:Regular rate and rhythm, no murmurs or gallops Breast Exam:  Symmetrical in appearance no palpable mass or tenderness no supraclavicular axillary lymphadenopathy Abdomen: Slightly pendulous Pfannenstiel scar was noted Pelvic:BUS within normal limits Vagina: No lesions or discharge Cervix: No lesions or discharge Uterus: Anteverted irregular shaped nontender Adnexa: No palpable mass or tenderness Extremities: No cords, no edema Rectal: Unremarkable    Assessment/Plan: Patient scheduled to undergo laparoscopic-assisted vaginal hysterectomy with bilateral salpingo-oophorectomy possible total abdominal hysterectomy with bilateral salpingo-oophorectomy which will be determined when patient is asleep depending on the examination under anesthesia to see if there is room vaginally to complete the operation vaginally. Patient was fully informed of this. Patient was given a prescription of Uribell to take 1 tablet twice a day the day before the surgery as well as the morning of the surgery which will help with the cystoscopy to determine ureteral patency after the procedure is completed. She was also given a prescription of Percocet 7.5/500 to take 1 by mouth every 4-6 hours when necessary postoperative pain as well as Reglan 10 mg 1 by mouth every 4 6 hour when necessary nausea or vomiting. The risks benefits and pros and cons of the operation were discussed with the patient as follows:                        Patient was counseled as to the risk of surgery to include the following:  1. Infection (prohylactic antibiotics will be administered)  2. DVT/Pulmonary Embolism (prophylactic pneumo compression stockings will be used)  3.Trauma to internal organs requiring additional surgical procedure to repair any injury to     Internal organs requiring perhaps additional hospitalization days.  4.Hemmorhage requiring transfusion and blood products which carry risks such as             anaphylactic reaction, hepatitis and AIDS  Patient had received literature  information on the procedure scheduled and all her questions were answered and fully accepts all risk.   [facility] HMD3:33 PMTD@Note :     Terrance Mass 07/16/2014, 2:41 PM  Note: This dictation was prepared with  Dragon/digital dictation along withSmart phrase technology. Any transcriptional errors that result from this process are unintentional.

## 2014-07-24 NOTE — Anesthesia Procedure Notes (Signed)
Procedure Name: Intubation Date/Time: 07/24/2014 7:25 AM Performed by: Jonna Munro Pre-anesthesia Checklist: Patient identified, Emergency Drugs available, Suction available, Patient being monitored and Timeout performed Patient Re-evaluated:Patient Re-evaluated prior to inductionOxygen Delivery Method: Circle system utilized Preoxygenation: Pre-oxygenation with 100% oxygen Intubation Type: IV induction Tube size: 7.0 mm Number of attempts: 1 Airway Equipment and Method: Stylet and Video-laryngoscopy Placement Confirmation: ETT inserted through vocal cords under direct vision,  positive ETCO2 and breath sounds checked- equal and bilateral Secured at: 21 cm Tube secured with: Tape Dental Injury: Teeth and Oropharynx as per pre-operative assessment

## 2014-07-24 NOTE — Transfer of Care (Signed)
Immediate Anesthesia Transfer of Care Note  Patient: [name]  Procedure(s) Performed: Procedure(s): LAPAROSCOPIC ASSISTED VAGINAL HYSTERECTOMY (N/A) LAPAROSCOPIC SALPINGO OOPHORECTOMY (Bilateral)  Patient Location: PACU  Anesthesia Type:General  Level of Consciousness: awake, alert  and oriented  Airway & Oxygen Therapy: Patient Spontanous Breathing and Patient connected to face mask oxygen  Post-op Assessment: Report given to RN and Post -op Vital signs reviewed and stable  Post vital signs: Reviewed and stable  Last Vitals:  Filed Vitals:   07/24/14 0610  BP: 137/72  Pulse: 71  Temp: 36.4 C  Resp: 18    Complications: No apparent anesthesia complications

## 2014-07-24 NOTE — Anesthesia Postprocedure Evaluation (Signed)
Anesthesia Post Note  Patient: [name]  Procedure(s) Performed: Procedure(s) (LRB): LAPAROSCOPIC ASSISTED VAGINAL HYSTERECTOMY (N/A) LAPAROSCOPIC SALPINGO OOPHORECTOMY (Bilateral)  Anesthesia type: General  Patient location: PACU  Post pain: Pain level controlled  Post assessment: Post-op Vital signs reviewed  Last Vitals:  Filed Vitals:   07/24/14 1115  BP: 131/69  Pulse: 91  Temp: 36.3 C  Resp: 16    Post vital signs: Reviewed  Level of consciousness: sedated  Complications: No apparent anesthesia complications

## 2014-07-25 ENCOUNTER — Encounter (HOSPITAL_COMMUNITY): Payer: Self-pay | Admitting: Gynecology

## 2014-07-25 DIAGNOSIS — N840 Polyp of corpus uteri: Secondary | ICD-10-CM | POA: Diagnosis not present

## 2014-07-25 LAB — CBC
HEMATOCRIT: 36.1 % (ref 36.0–46.0)
Hemoglobin: 11.6 g/dL — ABNORMAL LOW (ref 12.0–15.0)
MCH: 28.1 pg (ref 26.0–34.0)
MCHC: 32.1 g/dL (ref 30.0–36.0)
MCV: 87.4 fL (ref 78.0–100.0)
Platelets: 222 10*3/uL (ref 150–400)
RBC: 4.13 MIL/uL (ref 3.87–5.11)
RDW: 13.7 % (ref 11.5–15.5)
WBC: 10.5 10*3/uL (ref 4.0–10.5)

## 2014-07-25 NOTE — Progress Notes (Signed)
Pt discharged home with husband... Discharge instructions reviewed with pt and she verbalized understanding... Condition stable... No equipment... Taken to car via wheelchair by Santiago Bur, NT.

## 2014-07-25 NOTE — Discharge Summary (Signed)
Physician Discharge Summary  Patient ID: [name] MRN: [id number] DOB/AGE: [birth year] 59 y.o.  Admit date: 07/24/2014 Discharge date: 07/25/2014  Admission Diagnoses: Postmenopausal thickened endometrium, endometrial polyp  Discharge Diagnoses: Postmenopausal thickened endometrium, endometrial polyp     Discharged Condition: good  Hospital Course: Patient was admitted to the hospital on 07/25/2014 where she underwent a laparoscopic-assisted vaginal hysterectomy with bilateral salpingo-oophorectomy. Patient had a low-dose PCA pump with morphine which was discontinued 0500 hrs. this morning. She Had a Foley Catheter with Adequate Urinary Output and Clear Urine Which Was Discontinued Also a 500 Hours. Her Diet Was Advanced to Clear Liquid to Full Regular Diet Which She Was Able to Tolerate This Morning. She Was up and Ambulating Tolerating Regular Diet Well. She Also Had Passed Flatus. Her Abdomen Was Soft Nontender No Rebound or Guarding Port Sites Completely Healed. Vital Signs Were Stable.. Patient Afebrile Patient Rated Be Discharged Home  Consults: None  Significant Diagnostic Studies: labs: Preoperative hemoglobin 13.8 postoperative hemoglobin 11.6  Treatments: surgery: Laparoscopic-assisted vaginal hysterectomy bilateral salpingo-oophorectomy   Discharge Exam: Blood pressure 108/57, pulse 91, temperature 98.5 F (36.9 C), temperature source Oral, resp. rate 20, SpO2 95 %. General appearance: alert and cooperative Resp: clear to auscultation bilaterally Cardio: regular rate and rhythm, S1, S2 normal, no murmur, click, rub or gallop GI: soft, non-tender; bowel sounds normal; no masses,  no organomegaly Extremities: extremities normal, atraumatic, no cyanosis or edema Incision/Wound:  Disposition: 01-Home or Self Care  Discharge Instructions    Call MD for:  redness, tenderness, or signs of infection (pain, swelling, bleeding, redness, odor or green/yellow discharge around  incision site)    Complete by:  As directed      Call MD for:  severe or increased pain, loss or decreased feeling  in affected limb(s)    Complete by:  As directed      Call MD for:  temperature >100.5    Complete by:  As directed      Discharge instructions    Complete by:  As directed   Hysterectomy, Abdominal & Vaginal Care After Refer to this sheet in the next few weeks. These discharge instructions provide you with general information on caring for yourself after you leave the hospital. Your caregiver may also give you specific instructions. Your treatment has been planned according to the most current medical practices available, but unavoidable complications sometimes occur. If you have any problems or questions after discharge, please call your caregiver. HOME CARE INSTRUCTIONS Healing will take time. You will have tenderness at the surgery site. There may be some swelling and bruising around this area if you had an abdominal hysterectomy. Have an adult stay with you the first 48 to 72 hours after surgery, and then for 1 to 2 weeks afterward to help with daily activities. Only take over-the-counter or prescription medicines for pain, discomfort, or fever as directed by your caregiver.  Do not take aspirin. It can cause bleeding.  Do not drive when taking pain medicine.  It will be normal to be sore for a couple weeks after surgery. See your caregiver if this seems to be getting worse rather than better.  Follow your caregiver's advice regarding diet, exercise, lifting, driving, and general activities.  Take showers instead of baths for a few weeks as directed.  You may resume your usual diet as directed.  Get plenty of rest and sleep.  Do not douche, use tampons, or have sexual intercourse until your caregiver says it is okay.  Change your bandages (dressings) as directed if you had an abdominal hysterectomy.  Take your temperature twice a day and write it down.  Do not drink alcohol  until your caregiver says it is okay.  If you develop constipation, you may take a mild laxative with your caregiver's permission. Eating bran foods helps with constipation problems. Drink enough water and fluids to keep your urine clear or pale yellow.  Do not sign any legal documents until you feel normal again.  Keep all of your follow-up appointments.  Make sure you and your family understand everything about your operation and recovery.  SEEK MEDICAL CARE IF: There is swelling, redness, or increasing pain in the wound area.  Fluid (pus) is coming from the wound.  You notice a bad smell coming from the wound or surgical dressing.  You have pain, redness, and swelling from the intravenous (IV) site.  The wound breaks open.  You feel dizzy.  You develop pain or bleeding when you urinate.  You develop diarrhea.  You feel sick to your stomach (nauseous) and throw up (vomit).  You develop abnormal vaginal discharge.  You develop a rash.  You have any type of abnormal reaction or develop an allergy to your medicine.  Your pain medicine is not relieving your pain.  seek immediate medical care if: You have an oral temperature above 100, not controlled by medicine. HYYou develop chest pain.  You develop shortness of breath.  You pass out (faint).  You develop pain, swelling, or redness of your leg.  You develop heavy vaginal bleeding, with or without blood clots.  MAKE SURE YOU: Understand these instructions.  Will watch your condition.  Will get help right away if you are not doing well or get worse.  Document Released: 04/18/2003 Document Re-Released: 07/16/2009 [facility] Patient Information 2011 [place].   [facility] [id number] AMTD@Post  op appointment with Dr. Toney Rakes in 2 weeks as previously scheduled     Driving Restrictions    Complete by:  As directed   No driving for 1 weeks     Lifting restrictions    Complete by:  As directed   No lifting for 6 weeks      Resume previous diet    Complete by:  As directed             Medication List    STOP taking these medications        URIBEL 118 MG Caps      TAKE these medications        CALCIUM PO  Take 1,500 mg by mouth daily.     levothyroxine 100 MCG tablet  Commonly known as:  SYNTHROID, LEVOTHROID  Take 100 mcg by mouth daily before breakfast.     metoCLOPramide 10 MG tablet  Commonly known as:  REGLAN  Take 1 tablet (10 mg total) by mouth 3 (three) times daily with meals.     MULTIVITAMIN PO  Take 2 tablets by mouth daily.     oxyCODONE-acetaminophen 7.5-325 MG per tablet  Commonly known as:  PERCOCET  Take 1 tablet by mouth every 4 (four) hours as needed.     PROBIOTIC DAILY PO  Take 1 tablet by mouth daily.         SignedTerrance Mass 07/25/2014, 10:48 AM

## 2014-08-08 ENCOUNTER — Ambulatory Visit (INDEPENDENT_AMBULATORY_CARE_PROVIDER_SITE_OTHER): Payer: BLUE CROSS/BLUE SHIELD | Admitting: Gynecology

## 2014-08-08 ENCOUNTER — Encounter: Payer: Self-pay | Admitting: Gynecology

## 2014-08-08 VITALS — BP 130/88

## 2014-08-08 DIAGNOSIS — Z09 Encounter for follow-up examination after completed treatment for conditions other than malignant neoplasm: Secondary | ICD-10-CM

## 2014-08-08 NOTE — Progress Notes (Signed)
   Patient is a 59 year old presented to the office today for 2 weeks postop visit. On 07/24/2014 patient underwent a laparoscopic-assisted vaginal hysterectomy with bilateral salpingo-oophorectomy and cystoscopy as a result of thickened endometrium, endometrial polyp and pelvic pressure and pain. Patient is doing well did not need pain medication postop and her postop hemoglobin was 11.6. Findings from surgery as well as pathology report and pictures were shared with the patient as follows:  FINDINGS: Thick adhesion noted lower uterine segment near bladder midline. Normal-appearing tubes and ovary. Appendix not seen. Smooth liver surface and gallbladder   Diagnosis Uterus, ovaries and fallopian tubes, with cervix - PROLIFERATIVE PHASE ENDOMETRIUM WITH BENIGN ENDOMETRIAL POLYP FORMATION. - UNDERLYING MYOMETRIUM DEMONSTRATES ADENOMYOSIS AND LEIOMYOMA FORMATION. - BENIGN CERVIX. - BENIGN BILATERAL FALLOPIAN TUBES WITH BENIGN PARATUBAL CYST FORMATION. - BENIGN BILATERAL OVARIES WITH CYSTIC FOLLICLES. - NO DYSPLASIA, ATYPIA, OR HYPERPLASIA OR MALIGNANCY IDENTIFIED.  Exam: Abdomen: Soft nontender no rebound or guarding incisional ports completely healed Pelvic: Bartholin urethra Skene was within normal limits Vagina no gross lesions on inspection vaginal cuff intact suture material present Bimanual exam no fullness or tenderness Rectal exam not done  Assessment/plan: Patient 2 weeks status post laparoscopic-assisted vaginal hysterectomy with bilateral salpingo-oophorectomy doing well. Patient will return back to the office in 3 weeks for final postop visit.

## 2014-08-31 ENCOUNTER — Encounter: Payer: Self-pay | Admitting: Gynecology

## 2014-08-31 ENCOUNTER — Ambulatory Visit (INDEPENDENT_AMBULATORY_CARE_PROVIDER_SITE_OTHER): Payer: BLUE CROSS/BLUE SHIELD | Admitting: Gynecology

## 2014-08-31 VITALS — BP 130/82

## 2014-08-31 DIAGNOSIS — Z09 Encounter for follow-up examination after completed treatment for conditions other than malignant neoplasm: Secondary | ICD-10-CM

## 2014-08-31 NOTE — Progress Notes (Signed)
   Patient is a 59 year old presented to the office today for 5 week postop visit. She is doing well  and was seen the office for her first postop visit 08/08/2014. On 07/24/2014 patient underwent a laparoscopic-assisted vaginal hysterectomy with bilateral salpingo-oophorectomy and cystoscopy as a result of thickened endometrium, endometrial polyp and pelvic pressure and pain. Patient is doing well did not need pain medication postop and her postop hemoglobin was 11.6. Findings from surgery as well as pathology report and pictures were shared with the patient as follows:  FINDINGS: Thick adhesion noted lower uterine segment near bladder midline. Normal-appearing tubes and ovary. Appendix not seen. Smooth liver surface and gallbladder   Diagnosis Uterus, ovaries and fallopian tubes, with cervix - PROLIFERATIVE PHASE ENDOMETRIUM WITH BENIGN ENDOMETRIAL POLYP FORMATION. - UNDERLYING MYOMETRIUM DEMONSTRATES ADENOMYOSIS AND LEIOMYOMA FORMATION. - BENIGN CERVIX. - BENIGN BILATERAL FALLOPIAN TUBES WITH BENIGN PARATUBAL CYST FORMATION. - BENIGN BILATERAL OVARIES WITH CYSTIC FOLLICLES. - NO DYSPLASIA, ATYPIA, OR HYPERPLASIA OR MALIGNANCY IDENTIFIED.  Exam: Abdomen: Soft nontender no rebound or guarding incisional ports completely healed Pelvic: Bartholin urethra Skene was within normal limits Vagina no gross lesions on inspection vaginal cuff intact suture material present, slightly friable Bimanual exam no fullness or tenderness Rectal exam not donesymptomatic. .  Assessment/plan: Patient 5 weeks status post laparoscopic-assisted vaginal hysterectomy with bilateral salpingo-oophorectomy doing well. Patient will return back in 2 weeks for final postop visit to reassure cells of the vaginal cuff has completely healed. Silver nitrate and Monsel solution was placed for hemostasis. Patient to refrain from any intercourse her strings activity until two-week follow-up visit.

## 2014-09-17 ENCOUNTER — Ambulatory Visit (INDEPENDENT_AMBULATORY_CARE_PROVIDER_SITE_OTHER): Payer: 59 | Admitting: Gynecology

## 2014-09-17 ENCOUNTER — Encounter: Payer: Self-pay | Admitting: Gynecology

## 2014-09-17 VITALS — BP 138/86

## 2014-09-17 DIAGNOSIS — Z09 Encounter for follow-up examination after completed treatment for conditions other than malignant neoplasm: Secondary | ICD-10-CM

## 2014-09-17 NOTE — Progress Notes (Signed)
   Patient presented to the office today for her final postop visit. She has been asymptomatic has done well from her surgery. On 07/24/2014 patient underwent a laparoscopic-assisted vaginal hysterectomy with bilateral salpingo-oophorectomy and cystoscopy as a result of thickened endometrium, endometrial polyp and pelvic pressure and pain. Patient is doing well did not need pain medication postop and her postop hemoglobin was 11.6. Findings from surgery as well as pathology report and pictures were shared with the patient as follows:  FINDINGS: Thick adhesion noted lower uterine segment near bladder midline. Normal-appearing tubes and ovary. Appendix not seen. Smooth liver surface and gallbladder   Diagnosis Uterus, ovaries and fallopian tubes, with cervix - PROLIFERATIVE PHASE ENDOMETRIUM WITH BENIGN ENDOMETRIAL POLYP FORMATION. - UNDERLYING MYOMETRIUM DEMONSTRATES ADENOMYOSIS AND LEIOMYOMA FORMATION. - BENIGN CERVIX. - BENIGN BILATERAL FALLOPIAN TUBES WITH BENIGN PARATUBAL CYST FORMATION. - BENIGN BILATERAL OVARIES WITH CYSTIC FOLLICLES. - NO DYSPLASIA, ATYPIA, OR HYPERPLASIA OR MALIGNANCY IDENTIFIED.  Exam: Abdomen: Soft nontender no rebound or guarding incisional ports completely healed Pelvic: Bartholin urethra Skene was within normal limits Vagina no gross lesions on inspection vaginal cuff intact  Bimanual exam no fullness or tenderness Rectal exam not done  Assessment/plan: Patient now over 6 weeks status post laparoscopic-assisted vaginal hysterectomy with bilateral salpingo-oophorectomy doing well. Patient may now resume full normal activity. Patient will be referred to the general surgeons for consideration for bariatric surgery as a result of her morbid obesity. Otherwise we'll see her back in one year or when necessary.

## 2014-09-17 NOTE — Patient Instructions (Signed)
Laparoscopic Gastric Band Surgery This surgery is done to help you lose weight.  BEFORE THE SURGERY  Do not gain any more weight once you know you will be having this surgery.  Arrange for someone to take you home from the hospital.  The day before the surgery:  Eat small liquid meals such as broth.  Use half of the surgical scrub, if given, to shower or bathe with. Do not use the surgical scrub to wash your hair. Use regular shampoo.  The day of the surgery:  Shower or bathe using the second half of the surgical scrub.  Arrive at your appointment time.  You will change into a hospital gown.  A tube (IV) will be put in your vein. SURGERY A band is put around the upper part of your stomach. This makes a small pouch which can hold only a small amount of food. The lower, bigger part of your stomach is below the band. The 2 parts stay connected by a small opening between the upper and the lower parts. Food goes through the opening to the lower part of your stomach more slowly than before the surgery. You will feel more full with smaller amounts of food. On the inner lining of the band around your stomach is a balloon. The balloon is empty during the surgery. Later, at an office visit, it is filled with fluid. Your doctor puts the fluid in through a tube (port) that is right under the skin of your belly. AFTER THE SURGERY  You will go to the recovery room.  You may be given pain medicine.  You may be asked to walk once you are stable.  You may have shoulder pain caused by the gas.  By the time you go home, try to walk for 35 minutes every day.  You will be shown how to use a small breathing machine (incentive spirometer). This will help you take deep breaths. You need to use this machine several times a day while you are in the hospital and after you go home.  You will need to take another test. For this test, you will swallow a liquid that will show up on X-ray. You will have  X-rays taken while you are in different positions. These X-rays will show:  If your new stomach has any leaks.  How well your new stomach holds liquids.  How the liquid moves down to your gut.  Try to not throw up (vomit). Tell your doctor if you feel sick to your stomach (nauseous). There is medicine to help keep you from throwing up.  Your diet will begin with drinking liquids (jello, tea, juice and broth) in small amounts.  Your doctor will decide when you are ready to drink or eat more. Document Released: 02/28/2010 Document Revised: 05/23/2012 Document Reviewed: 02/28/2010 [facility] Patient Information 2015 [place], [place]. This information is not intended to replace advice given to you by your health care provider. Make sure you discuss any questions you have with your health care provider. Bariatric Surgery Information Severe obesity is difficult to treat through diet and exercise alone. Bariatric surgery (also called weight loss surgery) is an option for people who are severely obese and cannot lose weight by traditional means, or who suffer from serious obesity-related health problems. The surgery promotes weight loss by decreasing the absorption of food and, in some cases, by interrupting the digestive process. As in other treatments for obesity, best results are achieved with healthy eating behaviors and regular physical  activity.  People who may consider bariatric surgery include those with a body mass index (BMI) above 40. Men with a BMI of 40 are about 100 lb (45 kg) overweight and women with this BMI are about 80 lb (36 kg) overweight. People with a BMI between 35 and 40 and who suffer from type 2 diabetes or life-threatening heart and lung (cardiopulmonary) problems, such as severe sleep apnea or obesity-related heart disease, may also be candidates for surgery.  THE NORMAL DIGESTIVE PROCESS Normally, as food moves along the digestive tract, digestive juices and enzymes digest and  absorb calories and nutrients. After we chew and swallow our food, it moves down the esophagus to the stomach. There a strong acid continues the digestive process. When the stomach contents move to the first portion of the small intestine (duodenum), bile and pancreatic juice speed up digestion. The jejunum and ileum are the remaining two segments of the small intestine. They complete the absorption of almost all calories and nutrients. The food particles that cannot be digested in the small intestine are stored in the large intestine until they are eliminated.  HOW DOES SURGERY PROMOTE WEIGHT LOSS? Bariatric surgery alters the digestive process. The surgery closes off parts of the stomach to make it smaller, restricting the amount of food the stomach can hold. There are two types of bariatric surgeries: restrictive surgeries and malabsorptive surgeries. Restrictive surgeries only reduce stomach size. They do not interfere with the normal digestive process. Malabsorptive surgeries combine stomach restriction with a partial bypass of the small intestine. These types of procedures create a direct connection from the stomach to the lower segment of the small intestine. The connection causes food to bypass the portions of the digestive tract that absorb calories and nutrients.Malabsorptive surgeries are the most common surgeries for weight loss. They restrict both food intake and the amount of calories and nutrients the body absorbs.  Restrictive surgeries lead to weight loss in almost all patients. But they are less successful than malabsorptive surgeries in achieving substantial, long-term weight loss. Some patients regain weight. Others are unable to adjust their eating habits and fail to lose the desired weight. Successful results depend on the patient's willingness to adopt a long-term plan of healthy eating and regular physical activity.  RESTRICTIVE SURGERY To perform a restrictive surgery, health care  providers create a small pouch at the top of the stomach where food enters from the esophagus. At first, the pouch holds about 1 oz (28 g) of food. It later expands to hold 2-3 oz (56-84 g). The lower outlet of the pouch usually has a diameter of about  inch (1.9 cm). This small outlet delays the emptying of food from the pouch and causes a feeling of fullness. As a result of this surgery, most people lose the ability to eat large amounts of food at one time. After the surgery, the person usually can eat only  to 1 c (about 2 L) of food without discomfort or nausea. Also, food has to be well chewed.  There are several types of procedures that create this pouch. Adjustable Gastric Banding In this procedure, a hollow band is placed around the stomach near its upper end. This creates a small pouch and a narrow passage into the larger remainder of the stomach. The band is then inflated with a salt solution. It can be tightened or loosened over time to change the size of the passage by increasing or decreasing the amount of salt solution.  The  band is adjusted based on feelings of hunger and weight loss. Patients decide when they need an adjustment and come to their surgeons to be evaluated. The adjustment is done as an office visit. The band is fully reversible with a second surgery if the patient changes his or her mind. There is no cutting or rerouting of the intestine.  Vertical Banded Gastroplasty This is the most common restrictive surgery for weight control. Both a band and staples are used to create a small stomach pouch. Vertical banded gastroplasty is based on the same principle of restriction as adjustable gastric banding, but the stomach is surgically altered with the stapling. This treatment is not reversible.  About 30% of those who undergo vertical banded gastroplasty achieve normal weight. About 80% achieve some degree of weight loss. MALABSORPTIVE SURGERY Malabsorptive surgeries produce more  weight loss than restrictive surgeries. And they are more effective in reversing the health problems associated with severe obesity. Patients who have malabsorptive surgeries generally lose two-thirds of their excess weight within 2 years. There are several types of malabsorptive surgeries. Each one carries its own benefits and risks.  Roux-en-Y Gastric Bypass (RGB) This surgery is the most common and successful malabsorptive surgery. First, a small stomach pouch is created to restrict food intake. Next, a y-shaped section of the small intestine is attached to the pouch. This allows food to bypass the lower stomach, the duodenum, and the first portion of jejunum. This reduces the amount of calories and nutrients the body absorbs.  Biliopancreatic diversion (BPD) In this more complicated malabsorptive surgery, portions of the stomach are removed. The small pouch that remains is connected directly to the final segment of the small intestine, completely bypassing the duodenum and the jejunum.  This procedure successfully promotes weight loss. But it is less frequently used than other types of surgery because of the high risk for nutritional deficiencies. A variation of this procedure includes a "duodenal switch." This leaves a larger portion of the stomach intact, including the valve that regulates the release of stomach contents into the small intestine (pyloric valve). It also keeps a small part of the duodenum in the digestive pathway.  WHAT ARE THE BENEFITS AND RISKS OF BARIATRIC SURGERY? General Benefits  Right after surgery, most patients lose weight quickly. They continue to lose weight for 18-24 months after the procedure. Most patients regain 5-10% of the weight they lost, but many maintain a long-term weight loss of about 100 lb (45 kg).   Most obesity-related conditions, such as abnormal blood sugar levels, improve after the surgery.  General Risks  Infection.  Abdominal hernias.    Breakdown of the staple line.   Stretched stomach outlets.  Development of gallstones. These are clumps of cholesterol and other matter that form in the gallbladder. During quick or substantial weight loss, one's risk of developing gallstones increases.   Nutritional deficiencies. Nearly 30% of patients who have bariatric surgery develop nutritional deficiencies. These include anemia, osteoporosis, and metabolic bone disease.  A leak from any of the surgical connections (anastomoses). This is life-threatening. The more involved the surgery, the more risk involved.   Inability to lose weight or weight gain. Patients who do not follow a strict diet will stretch out their stomach pouches and they will not lose weight.   Dumping syndrome. This occurs when stomach contents move too rapidly through the small intestine causing cramping, diarrhea, nausea, palpitations, sweating, bloating, and dizziness or fainting.  Specific Risks of Restrictive Surgeries  Vomiting. This  occurs when the small stomach is overly stretched by food particles that have not been chewed well.   Band slippage and saline leakage. This may occur after adjustable gastric banding.   Band erosion into the lumen of the stomach.   Wearing away of the band and breakdown of the staple line. This may occur after vertical banded gastroplasty. In a small number of cases, stomach juices may leak into the abdomen. If this happens, an emergency surgery is needed.   Death from complications (rare). This happens in only about 1% of cases.   Stomach prolapse. Specific Risks of Malabsorptive Surgeries In addition to the risks of restrictive surgeries, malabsorptive surgeries also carry greater risk for nutritional deficiencies. This is because the procedure causes food to bypass the duodenum and jejunum. That is where most iron and calcium are absorbed. The more extensive the bypass, the greater the risk is for  complications and nutritional deficiencies, including:    Anemia.  Osteoporosis.   Metabolic bone disease. Follow-up surgeries to correct complications are needed in about 10-20% of patients.  FOR MORE INFORMATION American Society for Metabolic & Bariatric Surgery: www.asmbs.org  Weight-control Information Network (WIN): win.AmenCredit.is Document Released: 01/26/2005 Document Revised: 01/31/2013 Document Reviewed: 07/26/2012 [facility] Patient Information 2015 [place], [place]. This information is not intended to replace advice given to you by your health care provider. Make sure you discuss any questions you have with your health care provider.

## 2014-09-27 ENCOUNTER — Encounter: Payer: Self-pay | Admitting: Gynecology

## 2014-10-02 ENCOUNTER — Encounter: Payer: Self-pay | Admitting: Gynecology

## 2014-10-04 ENCOUNTER — Other Ambulatory Visit: Payer: Self-pay | Admitting: Radiology

## 2014-10-08 ENCOUNTER — Encounter: Payer: Self-pay | Admitting: Gynecology

## 2015-01-01 NOTE — Progress Notes (Signed)
Location of Breast Cancer:Left Breast  Histology per Pathology Report:       Receptor Status: ER(90%), PR (0), Her2-neu (0), Oncotype score of 13  Did patient present with symptoms (if so, please note symptoms) or was this found on screening mammography?:   Past/Anticipated interventions by surgeon, if JTT:[id number] of Left Breast  Past/Anticipated interventions by medical oncology, if any: Chemotherapy : Dr. Danice Goltz Sorscher -  Antiestrogen Therapy  Lymphedema issues, if any: No  Pain issues, if any:  No  SAFETY ISSUES:  Prior radiation? No  Pacemaker/ICD? No  Possible current pregnancy?No  Is the patient on methotrexate? No  Current Complaints / other details:    Menarche ag, G2, P2, No Birth control, menopause age 70,  Hysterectomy 07/24/14, NO HRT    Marlow Berenguer, Crista Curb, RN 01/01/2015,4:00 PM

## 2015-01-02 ENCOUNTER — Ambulatory Visit
Admission: RE | Admit: 2015-01-02 | Discharge: 2015-01-02 | Disposition: A | Discharge status: Home / Self Care | Payer: 59 | Source: Ambulatory Visit | Attending: Radiation Oncology | Admitting: Radiation Oncology

## 2015-01-02 ENCOUNTER — Encounter: Payer: Self-pay | Admitting: Radiation Oncology

## 2015-01-02 VITALS — BP 118/72 | HR 78 | Temp 97.9°F | Ht 63.0 in | Wt 264.4 lb

## 2015-01-02 DIAGNOSIS — E669 Obesity, unspecified: Secondary | ICD-10-CM | POA: Insufficient documentation

## 2015-01-02 DIAGNOSIS — Z170 Estrogen receptor positive status [ER+]: Secondary | ICD-10-CM | POA: Insufficient documentation

## 2015-01-02 DIAGNOSIS — C50912 Malignant neoplasm of unspecified site of left female breast: Secondary | ICD-10-CM | POA: Insufficient documentation

## 2015-01-02 DIAGNOSIS — F1010 Alcohol abuse, uncomplicated: Secondary | ICD-10-CM | POA: Insufficient documentation

## 2015-01-02 DIAGNOSIS — N8500 Endometrial hyperplasia, unspecified: Secondary | ICD-10-CM | POA: Insufficient documentation

## 2015-01-02 DIAGNOSIS — E039 Hypothyroidism, unspecified: Secondary | ICD-10-CM | POA: Diagnosis not present

## 2015-01-02 DIAGNOSIS — Z803 Family history of malignant neoplasm of breast: Secondary | ICD-10-CM | POA: Diagnosis not present

## 2015-01-02 DIAGNOSIS — C50412 Malignant neoplasm of upper-outer quadrant of left female breast: Secondary | ICD-10-CM | POA: Insufficient documentation

## 2015-01-02 DIAGNOSIS — Z510 Encounter for antineoplastic radiation therapy: Secondary | ICD-10-CM | POA: Diagnosis present

## 2015-01-02 DIAGNOSIS — Z87891 Personal history of nicotine dependence: Secondary | ICD-10-CM | POA: Insufficient documentation

## 2015-01-02 HISTORY — DX: Other specified postprocedural states: Z98.890

## 2015-01-02 NOTE — Addendum Note (Signed)
Encounter addended by: Benn Moulder, RN on: 01/02/2015  6:14 PM<BR>     Documentation filed: Charges VN

## 2015-01-02 NOTE — Progress Notes (Signed)
[facility] NEW PATIENT EVALUATION  Name: [name] MRN: [id number]  Date:   01/02/2015           DOB: Mar 16, 1955  Status: outpatient   CC: Shirline Frees, MD  Sorscher, Danice Goltz, * , Dr. Neysa Hotter, Dr. 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 female who is a pleasant 59 year old female who is seen today through the courtesy of Dr. Shary Decamp for consideration of radiation therapy following conservative surgery in the management of her T1c N0 invasive ductal carcinoma of left breast.  She presented with an abnormal screening mammogram at [facility] on 09/25/2014.  There was the question of architectural distortion within the left breast.  Additional views on 10/01/2014 showed a possible mass within the left breast central to the nipple, middle depth.  Ultrasound showed a 2.2 cm irregular lesion within the left breast, upper outer quadrant posterior depth.  Ultrasound-guided biopsy on 10/04/2014 was diagnostic for "invasive mammary carcinoma/mammary carcinoma in situ" which was E-cadherin positive indicating ductal carcinoma.  The tumor was ER positive and progesterone receptor negative.  HER-2/neu was negative.  She was seen by Dr. Clovis Riley who along with Dr. Luetta Nutting performed a left partial mastectomy, sentinel lymph node biopsy along with bilateral reduction mammoplasties on 11/19/2014.  She was found have a 1.8 cm invasive ductal carcinoma without DCIS.  The closest margin was the deep margin, 1.0 cm.  2 sentinel lymph nodes and an additional lymph node were without evidence for metastatic disease.  He was seen in consultation by Dr. Shary Decamp of medical oncology and she is referred here for radiation therapy closer to home.  Her Oncotype DX score is 13.  It is recommended that she receive antiestrogen therapy  following completion of radiation therapy.  She is without complaints today except for mild bilateral central erythema along her breast.  This is apparently unchanged over the past month.  She tells me she underwent genetic testing and was found to be negative.  PREVIOUS RADIATION THERAPY: No   PAST MEDICAL HISTORY:  has a past medical history of Hypothyroid; Endometrial hyperplasia (05/2003); Obesity; and H/O colonoscopy (2013).     PAST SURGICAL HISTORY:  Past Surgical History  Procedure Laterality Date  . Thyoidectomy  1996  . Breast surgery  2001    BREAST BIOPSY  . Resectoscopic polypectomy  04/2004, 05/2006  . Endometrial ablation  05/2006    RESECTOSCOPIC POLYPECTOMY AND ABLATION  . Cesarean section      PIH, swolen legs  . Laparoscopic assisted vaginal hysterectomy N/A 07/24/2014    Procedure: LAPAROSCOPIC ASSISTED VAGINAL HYSTERECTOMY WITH CYSTOSCOPY;  Surgeon: Terrance Mass, MD;  Location: [facility];  Service: Gynecology;  Laterality: N/A;  . Laparoscopic salpingo oopherectomy Bilateral 07/24/2014    Procedure: LAPAROSCOPIC SALPINGO OOPHORECTOMY;  Surgeon: Terrance Mass, MD;  Location: [facility];  Service: Gynecology;  Laterality: Bilateral;  . Abdominal hysterectomy       FAMILY HISTORY: family history includes Breast cancer in her maternal aunt and mother; Hypertension in her father.  Her father died from cardiac disease at 45.  Her mother is alive and well at age 37, having been diagnosed with breast cancer at 66.  She has 1 maternal aunt who was diagnosed with breast cancer in her early 41s and a paternal aunt  who was diagnosed with breast cancer in her 39s.   SOCIAL HISTORY:  reports that she has quit smoking. She does not have any smokeless tobacco history on file. She reports that she drinks alcohol. She reports that she does not use illicit drugs.  Married, 2 children.  She husband operate two [place].   ALLERGIES: Review of patient's  allergies indicates no known allergies.   MEDICATIONS:  Current Outpatient Prescriptions  Medication Sig Dispense Refill  . CALCIUM PO Take 1,500 mg by mouth daily.     Marland Kitchen levothyroxine (SYNTHROID, LEVOTHROID) 100 MCG tablet Take 100 mcg by mouth daily before breakfast.    . Multiple Vitamin (MULTIVITAMIN PO) Take 2 tablets by mouth daily.     . Multiple Vitamins-Minerals (MULTIVITAMIN ADULT PO) Take 1 tablet by mouth 2 (two) times daily.    . Omega-3 Fatty Acids (FISH OIL) 1000 MG CAPS Take 1 capsule by mouth.    . letrozole (FEMARA) 2.5 MG tablet Take 2.5 mg by mouth daily.    Marland Kitchen oxyCODONE-acetaminophen (PERCOCET) 7.5-325 MG per tablet Take 1 tablet by mouth every 4 (four) hours as needed. (Patient not taking: Reported on 09/17/2014) 30 tablet 0   No current facility-administered medications for this encounter.     REVIEW OF SYSTEMS:  Pertinent items are noted in HPI.    PHYSICAL EXAM:  height is $RemoveB'5\' 3"'eOUEbLdP$  (1.6 m) and weight is 264 lb 6.4 oz (119.931 kg). Her temperature is 97.9 F (36.6 C). Her blood pressure is 118/72 and her pulse is 78.   Alert and oriented 59 year old female appearing her stated age.  Head and neck examination: Grossly unremarkable.  Nodes: Without palpable cervical, supraclavicular, or axillary lymphadenopathy.  Breasts: She has bilateral reduction mammoplasty wounds which are healing well except for focal granulating tissue at 6:00 along the left inframammary region.  There is bilateral nipple areolar induration as expected.  There is central erythema along both breasts.  Extremities: Without edema.   LABORATORY DATA:  Lab Results  Component Value Date   WBC 10.5 07/25/2014   HGB 11.6* 07/25/2014   HCT 36.1 07/25/2014   MCV 87.4 07/25/2014   PLT 222 07/25/2014   Lab Results  Component Value Date   NA 140 07/18/2014   K 4.3 07/18/2014   CL 106 07/18/2014   CO2 28 07/18/2014   No results found for: ALT, AST, GGT, ALKPHOS, BILITOT    IMPRESSION:   Stage I A  (T1c N0 M0) invasive ductal carcinoma of the left breast.  We discussed local management options which include mastectomy versus partial mastectomy followed by radiation therapy.  We discussed the potential acute and late toxicities of radiation therapy.  We discussed standard fractionation versus hypofractionated treatment.  There is little if any literature reporting hypofractionated treatment in patients having undergone reduction mammoplasty at the time of lumpectomy.  Based on her estimated > 25cm field separation (distance between the medial tangent border and lateral tangent border) she will be best served by standard fractionated treatment.  She may be a candidate for deep inspiration breath-hold technology should standard tangent fields, come in proximity of the heart.  She will see Dr. Luetta Nutting for a follow-up visit on December 2.  I suspect that she should be ready to begin her radiation therapy the week of December 5, almost 2 months out from her surgery.  Therefore, I will have her return for CT simulation early next week, but wait until Dr. Luetta Nutting tells that it is  safe to begin radiation therapy.  I suspect that the bilateral breast erythema is secondary to her surgery rather than infection or from sentinel lymph node tracer. Consent is signed today.  Lastly, with respect to starting aromatase therapy I typically favor starting after completion of radiation therapy.   PLAN: As discussed above.  I spent 60  minutes face to face with the patient and more than 50% of that time was spent in counseling and/or coordination of care.

## 2015-01-06 NOTE — Addendum Note (Signed)
Encounter addended by: Benn Moulder, RN on: 01/06/2015  3:22 PM<BR>     Documentation filed: Arn Medal VN

## 2015-01-07 ENCOUNTER — Ambulatory Visit
Admission: RE | Admit: 2015-01-07 | Discharge: 2015-01-07 | Disposition: A | Discharge status: Home / Self Care | Payer: 59 | Source: Ambulatory Visit | Attending: Radiation Oncology | Admitting: Radiation Oncology

## 2015-01-07 DIAGNOSIS — Z510 Encounter for antineoplastic radiation therapy: Secondary | ICD-10-CM | POA: Diagnosis not present

## 2015-01-07 DIAGNOSIS — C50412 Malignant neoplasm of upper-outer quadrant of left female breast: Secondary | ICD-10-CM

## 2015-01-07 NOTE — Progress Notes (Signed)
Complex simulation/treatment planning note: The patient was taken to the CT simulator.  A Vac lock immobilization device was constructed on a custom breast board.  Her left breast field borders were marked with radiopaque wires, and I marked the edges of her inferior breast scar from her reduction mammoplasty. She was then scanned free breathing.  The cardiac silhouette was near standard tangent fields, and she was rescanned with deep inspiration breath-hold which improved movement of the cardiac silhouette away from her breast tangents. The CT data set was sent to the planning system and her normal  anatomy was contoured.   Her tumor bed was not contoured.  I also contoured BBs marking the lateral and medial aspects of her lower breast wound  from her reduction mammoplasty. She was setup to medial and lateral left breast tangents in 2 separate and unique multileaf collimators were designed to conform the field. I'm prescribing 5040 cGy in 28 sessions.   No boost. Of note is that her field separation was approximately 31.5 cm.

## 2015-01-07 NOTE — Progress Notes (Signed)
Special treatment procedure was performed today due to the extra time and effort required by myself to plan and prepare this patient for deep inspiration breath hold technique.  I have determined cardiac sparing to be of benefit to this patient to prevent long term cardiac damage due to radiation of the heart.  Bellows were placed on the patient's abdomen. To facilitate cardiac sparing, the patient was coached by the radiation therapists on breath hold techniques and breathing practice was performed. Practice waveforms were obtained. The patient was then scanned while maintaining breath hold in the treatment position.  This image was then transferred over to the imaging specialist. The imaging specialist then created a fusion of the free breathing and breath hold scans using the chest wall as the stable structure. I personally reviewed the fusion in axial, coronal and sagittal image planes.  Excellent cardiac sparing was obtained.  I felt the patient is an appropriate candidate for breath hold and the patient will be treated as such.  The image fusion was then reviewed with the patient to reinforce the necessity of reproducible breath hold.

## 2015-01-10 ENCOUNTER — Encounter: Payer: Self-pay | Admitting: Radiation Oncology

## 2015-01-10 DIAGNOSIS — Z510 Encounter for antineoplastic radiation therapy: Secondary | ICD-10-CM | POA: Diagnosis not present

## 2015-01-10 NOTE — Progress Notes (Signed)
3-D simulation note:  The patient completed 3-D simulation in the management of her carcinoma of the left breast.  She was set up to medial and lateral left breast tangents with deep inspiration breath-hold.  2 unique sets of MLCs, and 2 unique electronic compensation fields were utilized for a total of 4 complex treatment devices.  Dose volume histograms were obtained for the breast and also avoidance structures including the heart and lungs.  We met our departmental guidelines.  I am prescribing 5040 cGy in 28 sessions utilizing 10 MV photons.  No boost.

## 2015-01-14 ENCOUNTER — Encounter: Payer: Self-pay | Admitting: Radiation Oncology

## 2015-01-14 ENCOUNTER — Ambulatory Visit
Admission: RE | Admit: 2015-01-14 | Discharge: 2015-01-14 | Disposition: A | Discharge status: Home / Self Care | Payer: 59 | Source: Ambulatory Visit | Attending: Radiation Oncology | Admitting: Radiation Oncology

## 2015-01-14 DIAGNOSIS — Z510 Encounter for antineoplastic radiation therapy: Secondary | ICD-10-CM | POA: Diagnosis not present

## 2015-01-14 NOTE — Progress Notes (Signed)
Pt here for patient teaching.  Pt given Radiation and You booklet, skin care instructions, Alra deodorant and Radiaplex gel. Pt reports they have watched, not watched and refused to watch the Radiation Therapy Education video on January 07, 2015.  Reviewed areas of pertinence such as fatigue, skin changes, breast tenderness and breast swelling . Pt able to give teach back of to pat skin, use unscented/gentle soap and drink plenty of water,apply Radiaplex bid, avoid applying anything to skin within 4 hours of treatment, avoid wearing an under wire bra and to use an electric razor if they must shave. Pt demonstrated understanding and verbalizes understanding of information given and will contact nursing with any questions or concerns.

## 2015-01-15 ENCOUNTER — Ambulatory Visit
Admission: RE | Admit: 2015-01-15 | Discharge: 2015-01-15 | Disposition: A | Discharge status: Home / Self Care | Payer: 59 | Source: Ambulatory Visit | Attending: Radiation Oncology | Admitting: Radiation Oncology

## 2015-01-15 DIAGNOSIS — Z510 Encounter for antineoplastic radiation therapy: Secondary | ICD-10-CM | POA: Diagnosis not present

## 2015-01-16 ENCOUNTER — Ambulatory Visit
Admission: RE | Admit: 2015-01-16 | Discharge: 2015-01-16 | Disposition: A | Discharge status: Home / Self Care | Payer: 59 | Source: Ambulatory Visit | Attending: Radiation Oncology | Admitting: Radiation Oncology

## 2015-01-16 DIAGNOSIS — Z510 Encounter for antineoplastic radiation therapy: Secondary | ICD-10-CM | POA: Diagnosis not present

## 2015-01-17 ENCOUNTER — Ambulatory Visit
Admission: RE | Admit: 2015-01-17 | Discharge: 2015-01-17 | Disposition: A | Discharge status: Home / Self Care | Payer: 59 | Source: Ambulatory Visit | Attending: Radiation Oncology | Admitting: Radiation Oncology

## 2015-01-17 DIAGNOSIS — Z510 Encounter for antineoplastic radiation therapy: Secondary | ICD-10-CM | POA: Diagnosis not present

## 2015-01-18 ENCOUNTER — Ambulatory Visit
Admission: RE | Admit: 2015-01-18 | Discharge: 2015-01-18 | Disposition: A | Discharge status: Home / Self Care | Payer: 59 | Source: Ambulatory Visit | Attending: Radiation Oncology | Admitting: Radiation Oncology

## 2015-01-18 DIAGNOSIS — Z510 Encounter for antineoplastic radiation therapy: Secondary | ICD-10-CM | POA: Diagnosis not present

## 2015-01-21 ENCOUNTER — Encounter: Payer: Self-pay | Admitting: Radiation Oncology

## 2015-01-21 ENCOUNTER — Ambulatory Visit
Admission: RE | Admit: 2015-01-21 | Discharge: 2015-01-21 | Disposition: A | Discharge status: Home / Self Care | Payer: 59 | Source: Ambulatory Visit | Attending: Radiation Oncology | Admitting: Radiation Oncology

## 2015-01-21 VITALS — BP 138/60 | HR 79 | Temp 98.3°F | Resp 16 | Wt 265.0 lb

## 2015-01-21 DIAGNOSIS — C50412 Malignant neoplasm of upper-outer quadrant of left female breast: Secondary | ICD-10-CM

## 2015-01-21 DIAGNOSIS — Z510 Encounter for antineoplastic radiation therapy: Secondary | ICD-10-CM | POA: Diagnosis not present

## 2015-01-21 NOTE — Progress Notes (Signed)
PAIN: She is currently in no pain. Reports occasional "tingling" to breast area. SKIN: Pt left breast- positive for slight erythema over breast and nipple area.  Pt denies edema.  Pt continues to apply Radiaplex as directed. OTHER: Pt complains of fatigue. BP 138/60 mmHg  Pulse 79  Temp(Src) 98.3 F (36.8 C) (Oral)  Resp 16  Wt 265 lb (120.203 kg)  SpO2 97% Wt Readings from Last 3 Encounters:  01/21/15 265 lb (120.203 kg)  01/02/15 264 lb 6.4 oz (119.931 kg)  07/18/14 263 lb (119.296 kg)

## 2015-01-21 NOTE — Progress Notes (Signed)
Weekly Management Note:  Site: left breast Current Dose:   900  cGy Projected Dose:  5040  cGy  Narrative: The patient is seen today for routine under treatment assessment. CBCT/MVCT images/port films were reviewed. The chart was reviewed.    She was seen by  Dr. Luetta Nutting last week who told she could proceed with radiation therapy.  She has undergone patient education and uses Radioplex gel.  Physical Examination:  Filed Vitals:   01/21/15 1043  BP: 138/60  Pulse: 79  Temp: 98.3 F (36.8 C)  Resp: 16  .  Weight: 265 lb (120.203 kg).  There remains erythema along the central breast and inframammary region related to her reduction mammoplasty wounds.  No obvious infection.  Impression: Tolerating radiation therapy well.  Plan: Continue radiation therapy as planned.

## 2015-01-22 ENCOUNTER — Ambulatory Visit
Admission: RE | Admit: 2015-01-22 | Discharge: 2015-01-22 | Disposition: A | Discharge status: Home / Self Care | Payer: 59 | Source: Ambulatory Visit | Attending: Radiation Oncology | Admitting: Radiation Oncology

## 2015-01-22 DIAGNOSIS — Z510 Encounter for antineoplastic radiation therapy: Secondary | ICD-10-CM | POA: Diagnosis not present

## 2015-01-23 ENCOUNTER — Ambulatory Visit
Admission: RE | Admit: 2015-01-23 | Discharge: 2015-01-23 | Disposition: A | Discharge status: Home / Self Care | Payer: 59 | Source: Ambulatory Visit | Attending: Radiation Oncology | Admitting: Radiation Oncology

## 2015-01-23 DIAGNOSIS — Z510 Encounter for antineoplastic radiation therapy: Secondary | ICD-10-CM | POA: Diagnosis not present

## 2015-01-24 ENCOUNTER — Ambulatory Visit
Admission: RE | Admit: 2015-01-24 | Discharge: 2015-01-24 | Disposition: A | Discharge status: Home / Self Care | Payer: 59 | Source: Ambulatory Visit | Attending: Radiation Oncology | Admitting: Radiation Oncology

## 2015-01-24 DIAGNOSIS — Z510 Encounter for antineoplastic radiation therapy: Secondary | ICD-10-CM | POA: Diagnosis not present

## 2015-01-25 ENCOUNTER — Ambulatory Visit
Admission: RE | Admit: 2015-01-25 | Discharge: 2015-01-25 | Disposition: A | Discharge status: Home / Self Care | Payer: 59 | Source: Ambulatory Visit | Attending: Radiation Oncology | Admitting: Radiation Oncology

## 2015-01-25 DIAGNOSIS — Z510 Encounter for antineoplastic radiation therapy: Secondary | ICD-10-CM | POA: Diagnosis not present

## 2015-01-28 ENCOUNTER — Ambulatory Visit
Admission: RE | Admit: 2015-01-28 | Discharge: 2015-01-28 | Disposition: A | Discharge status: Home / Self Care | Payer: 59 | Source: Ambulatory Visit | Attending: Radiation Oncology | Admitting: Radiation Oncology

## 2015-01-28 ENCOUNTER — Encounter: Payer: Self-pay | Admitting: Radiation Oncology

## 2015-01-28 VITALS — BP 122/68 | HR 75 | Temp 97.8°F | Ht 63.0 in | Wt 270.1 lb

## 2015-01-28 DIAGNOSIS — C50412 Malignant neoplasm of upper-outer quadrant of left female breast: Secondary | ICD-10-CM

## 2015-01-28 DIAGNOSIS — Z510 Encounter for antineoplastic radiation therapy: Secondary | ICD-10-CM | POA: Diagnosis not present

## 2015-01-28 NOTE — Progress Notes (Signed)
Weekly Management Note:  Site: left breast Current Dose:   1800  cGy Projected Dose:  5040  cGy  Narrative: The patient is seen today for routine under treatment assessment. CBCT/MVCT images/port films were reviewed. The chart was reviewed.    She is without complaints today. She uses Radioplex gel. She tells me she is scheduled for a bone density study at [facility] next week. She will probably receive adjuvant anti- estrogen therapy with an aromatase inhibitor.  Physical Examination:  Filed Vitals:   01/28/15 1044  BP: 122/68  Pulse: 75  Temp: 97.8 F (36.6 C)  .  Weight: 270 lb 1.6 oz (122.517 kg).  There is faint erythema along the left breast. No areas of desquamation.  Impression: Tolerating radiation therapy well.  Plan: Continue radiation therapy as planned.  Dr. Pablo Ledger will assume her care.

## 2015-01-28 NOTE — Progress Notes (Signed)
[name] has received10 fractions to hr left breast.  Note the usual redness in the areola region and in the inframmary fold.  Skin remains intact and she denies pain at this time.

## 2015-01-29 ENCOUNTER — Encounter: Payer: Self-pay | Admitting: Radiation Oncology

## 2015-01-29 ENCOUNTER — Ambulatory Visit
Admission: RE | Admit: 2015-01-29 | Discharge: 2015-01-29 | Disposition: A | Discharge status: Home / Self Care | Payer: 59 | Source: Ambulatory Visit | Attending: Radiation Oncology | Admitting: Radiation Oncology

## 2015-01-29 DIAGNOSIS — Z510 Encounter for antineoplastic radiation therapy: Secondary | ICD-10-CM | POA: Diagnosis not present

## 2015-01-30 ENCOUNTER — Ambulatory Visit
Admission: RE | Admit: 2015-01-30 | Discharge: 2015-01-30 | Disposition: A | Discharge status: Home / Self Care | Payer: 59 | Source: Ambulatory Visit | Attending: Radiation Oncology | Admitting: Radiation Oncology

## 2015-01-30 DIAGNOSIS — Z510 Encounter for antineoplastic radiation therapy: Secondary | ICD-10-CM | POA: Diagnosis not present

## 2015-01-31 ENCOUNTER — Ambulatory Visit
Admission: RE | Admit: 2015-01-31 | Discharge: 2015-01-31 | Disposition: A | Discharge status: Home / Self Care | Payer: 59 | Source: Ambulatory Visit | Attending: Radiation Oncology | Admitting: Radiation Oncology

## 2015-01-31 DIAGNOSIS — Z510 Encounter for antineoplastic radiation therapy: Secondary | ICD-10-CM | POA: Diagnosis not present

## 2015-02-01 ENCOUNTER — Ambulatory Visit
Admission: RE | Admit: 2015-02-01 | Discharge: 2015-02-01 | Disposition: A | Discharge status: Home / Self Care | Payer: 59 | Source: Ambulatory Visit | Attending: Radiation Oncology | Admitting: Radiation Oncology

## 2015-02-01 DIAGNOSIS — Z510 Encounter for antineoplastic radiation therapy: Secondary | ICD-10-CM | POA: Diagnosis not present

## 2015-02-05 ENCOUNTER — Encounter: Payer: Self-pay | Admitting: Radiation Oncology

## 2015-02-05 ENCOUNTER — Ambulatory Visit
Admission: RE | Admit: 2015-02-05 | Discharge: 2015-02-05 | Disposition: A | Discharge status: Home / Self Care | Payer: 59 | Source: Ambulatory Visit | Attending: Radiation Oncology | Admitting: Radiation Oncology

## 2015-02-05 VITALS — BP 136/81 | HR 68 | Temp 97.8°F | Ht 63.0 in | Wt 271.1 lb

## 2015-02-05 DIAGNOSIS — C50412 Malignant neoplasm of upper-outer quadrant of left female breast: Secondary | ICD-10-CM

## 2015-02-05 DIAGNOSIS — Z510 Encounter for antineoplastic radiation therapy: Secondary | ICD-10-CM | POA: Diagnosis not present

## 2015-02-05 NOTE — Progress Notes (Signed)
[name] has received 15 fractions to the left breast.  Denies any pain or tenderness to her left breast.  Appetite is good.  Skin to left breast is looks good today, using Radiplex Bid.  Energy level is usually good.  BP 136/81 mmHg  Pulse 68  Temp(Src) 97.8 F (36.6 C) (Oral)  Ht 5\' 3"  (1.6 m)  Wt 271 lb 1.6 oz (122.97 kg)  BMI 48.04 kg/m2  SpO2 100%

## 2015-02-05 NOTE — Progress Notes (Signed)
Weekly Management Note Current Dose: 27 Gy  Projected Dose: 50.4 Gy   Narrative:  The patient presents for routine under treatment assessment.  CBCT/MVCT images/Port film x-rays were reviewed.  The chart was checked.  [name] has received 15 fractions to the left breast. Denies any pain or tenderness to her left breast. Appetite is good. Skin to left breast is looks good today and is using Radiplex Bid. Energy level is usually good.  Physical Findings: Weight: 271 lb 1.6 oz (122.97 kg). Unchanged. Status post bilateral breast reduction and only mild hyperpigmentation over the left breast  Impression:  The patient is tolerating radiation.  Plan:  Continue treatment as planned. Continue radiaplex.  This document serves as a record of services personally performed by Thea Silversmith, MD. It was created on her behalf by Darcus Austin, a trained medical scribe. The creation of this record is based on the scribe's personal observations and the provider's statements to them. This document has been checked and approved by the attending provider.

## 2015-02-06 ENCOUNTER — Ambulatory Visit
Admission: RE | Admit: 2015-02-06 | Discharge: 2015-02-06 | Disposition: A | Discharge status: Home / Self Care | Payer: 59 | Source: Ambulatory Visit | Attending: Radiation Oncology | Admitting: Radiation Oncology

## 2015-02-06 DIAGNOSIS — Z510 Encounter for antineoplastic radiation therapy: Secondary | ICD-10-CM | POA: Diagnosis not present

## 2015-02-07 ENCOUNTER — Ambulatory Visit
Admission: RE | Admit: 2015-02-07 | Discharge: 2015-02-07 | Disposition: A | Discharge status: Home / Self Care | Payer: 59 | Source: Ambulatory Visit | Attending: Radiation Oncology | Admitting: Radiation Oncology

## 2015-02-07 DIAGNOSIS — Z510 Encounter for antineoplastic radiation therapy: Secondary | ICD-10-CM | POA: Diagnosis not present

## 2015-02-08 ENCOUNTER — Ambulatory Visit
Admission: RE | Admit: 2015-02-08 | Discharge: 2015-02-08 | Disposition: A | Discharge status: Home / Self Care | Payer: 59 | Source: Ambulatory Visit | Attending: Radiation Oncology | Admitting: Radiation Oncology

## 2015-02-08 DIAGNOSIS — Z510 Encounter for antineoplastic radiation therapy: Secondary | ICD-10-CM | POA: Diagnosis not present

## 2015-02-10 DIAGNOSIS — C50912 Malignant neoplasm of unspecified site of left female breast: Secondary | ICD-10-CM | POA: Diagnosis present

## 2015-02-10 DIAGNOSIS — N8500 Endometrial hyperplasia, unspecified: Secondary | ICD-10-CM | POA: Diagnosis not present

## 2015-02-10 DIAGNOSIS — Z87891 Personal history of nicotine dependence: Secondary | ICD-10-CM | POA: Diagnosis not present

## 2015-02-10 DIAGNOSIS — Z510 Encounter for antineoplastic radiation therapy: Secondary | ICD-10-CM | POA: Diagnosis present

## 2015-02-10 DIAGNOSIS — Z170 Estrogen receptor positive status [ER+]: Secondary | ICD-10-CM | POA: Diagnosis not present

## 2015-02-10 DIAGNOSIS — F1010 Alcohol abuse, uncomplicated: Secondary | ICD-10-CM | POA: Diagnosis not present

## 2015-02-10 DIAGNOSIS — Z803 Family history of malignant neoplasm of breast: Secondary | ICD-10-CM | POA: Diagnosis not present

## 2015-02-10 DIAGNOSIS — E039 Hypothyroidism, unspecified: Secondary | ICD-10-CM | POA: Diagnosis not present

## 2015-02-10 DIAGNOSIS — E669 Obesity, unspecified: Secondary | ICD-10-CM | POA: Diagnosis not present

## 2015-02-12 ENCOUNTER — Ambulatory Visit
Admission: RE | Admit: 2015-02-12 | Discharge: 2015-02-12 | Disposition: A | Discharge status: Home / Self Care | Payer: BLUE CROSS/BLUE SHIELD | Source: Ambulatory Visit | Attending: Radiation Oncology | Admitting: Radiation Oncology

## 2015-02-12 ENCOUNTER — Encounter: Payer: Self-pay | Admitting: Radiation Oncology

## 2015-02-12 ENCOUNTER — Ambulatory Visit
Admission: RE | Admit: 2015-02-12 | Discharge: 2015-02-12 | Disposition: A | Discharge status: Home / Self Care | Payer: 59 | Source: Ambulatory Visit | Attending: Radiation Oncology | Admitting: Radiation Oncology

## 2015-02-12 VITALS — BP 121/56 | HR 67 | Temp 98.6°F | Ht 63.0 in | Wt 271.0 lb

## 2015-02-12 DIAGNOSIS — Z510 Encounter for antineoplastic radiation therapy: Secondary | ICD-10-CM | POA: Diagnosis not present

## 2015-02-12 DIAGNOSIS — C50412 Malignant neoplasm of upper-outer quadrant of left female breast: Secondary | ICD-10-CM

## 2015-02-12 NOTE — Progress Notes (Signed)
[name] has received 19 fractions to her left breast.  Denies any pain or tenderness today.  Appetite is good.  Energy level is good.  Skin has dark brown color to nipple and brownish hyperpigmentation to left breast and axilla. BP 121/56 mmHg  Pulse 67  Temp(Src) 98.6 F (37 C) (Oral)  Ht 5\' 3"  (1.6 m)  Wt 271 lb (122.925 kg)  BMI 48.02 kg/m2  SpO2 100%

## 2015-02-12 NOTE — Progress Notes (Signed)
Weekly Management Note Current Dose: 34.2Gy  Projected Dose: 50.4 Gy   Narrative:  The patient presents for routine under treatment assessment.  CBCT/MVCT images/Port film x-rays were reviewed.  The chart was checked. Felt some nipples soreness and hardening around the nipple last week. Pain is better but hard area continues.   Physical Findings: Weight: 271 lb (122.925 kg). Unchanged. Status post bilateral breast reduction and  mild hyperpigmentation over the left breast. Nipple and posterior areolar area is hard with no discrete mass or signs of infection.   Impression:  The patient is tolerating radiation.  Plan:  Continue treatment as planned. Continue radiaplex.  This document serves as a record of services personally performed by Thea Silversmith, MD. It was created on her behalf by Darcus Austin, a trained medical scribe. The creation of this record is based on the scribe's personal observations and the provider's statements to them. This document has been checked and approved by the attending provider.

## 2015-02-13 ENCOUNTER — Ambulatory Visit
Admission: RE | Admit: 2015-02-13 | Discharge: 2015-02-13 | Disposition: A | Discharge status: Home / Self Care | Payer: 59 | Source: Ambulatory Visit | Attending: Radiation Oncology | Admitting: Radiation Oncology

## 2015-02-13 DIAGNOSIS — Z510 Encounter for antineoplastic radiation therapy: Secondary | ICD-10-CM | POA: Diagnosis not present

## 2015-02-14 ENCOUNTER — Ambulatory Visit
Admission: RE | Admit: 2015-02-14 | Discharge: 2015-02-14 | Disposition: A | Discharge status: Home / Self Care | Payer: 59 | Source: Ambulatory Visit | Attending: Radiation Oncology | Admitting: Radiation Oncology

## 2015-02-14 DIAGNOSIS — Z510 Encounter for antineoplastic radiation therapy: Secondary | ICD-10-CM | POA: Diagnosis not present

## 2015-02-15 ENCOUNTER — Ambulatory Visit
Admission: RE | Admit: 2015-02-15 | Discharge: 2015-02-15 | Disposition: A | Discharge status: Home / Self Care | Payer: 59 | Source: Ambulatory Visit | Attending: Radiation Oncology | Admitting: Radiation Oncology

## 2015-02-15 DIAGNOSIS — Z510 Encounter for antineoplastic radiation therapy: Secondary | ICD-10-CM | POA: Diagnosis not present

## 2015-02-18 ENCOUNTER — Ambulatory Visit
Admission: RE | Admit: 2015-02-18 | Discharge: 2015-02-18 | Disposition: A | Discharge status: Home / Self Care | Payer: 59 | Source: Ambulatory Visit | Attending: Radiation Oncology | Admitting: Radiation Oncology

## 2015-02-18 ENCOUNTER — Encounter: Payer: Self-pay | Admitting: Radiation Oncology

## 2015-02-18 ENCOUNTER — Ambulatory Visit
Admission: RE | Admit: 2015-02-18 | Discharge: 2015-02-18 | Disposition: A | Discharge status: Home / Self Care | Payer: BLUE CROSS/BLUE SHIELD | Source: Ambulatory Visit | Attending: Radiation Oncology | Admitting: Radiation Oncology

## 2015-02-18 VITALS — BP 107/66 | HR 76 | Temp 97.6°F | Resp 16 | Wt 273.7 lb

## 2015-02-18 DIAGNOSIS — C50412 Malignant neoplasm of upper-outer quadrant of left female breast: Secondary | ICD-10-CM

## 2015-02-18 DIAGNOSIS — Z510 Encounter for antineoplastic radiation therapy: Secondary | ICD-10-CM | POA: Insufficient documentation

## 2015-02-18 MED ORDER — RADIAPLEXRX EX GEL
Freq: Once | CUTANEOUS | Status: AC
Start: 2015-02-18 — End: 2015-02-18
  Administered 2015-02-18: 14:00:00 via TOPICAL

## 2015-02-18 NOTE — Progress Notes (Signed)
PAIN: She is currently in no pain. SKIN: Pt left breast- positive for Hyperpigmentation, erythema and breast tenderness.  Pt denies edema.  Pt continues to apply Radiaplex as directed. OTHER: Pt complains of fatigue. Refill of Radiaplex. BP 107/66 mmHg  Pulse 76  Temp(Src) 97.6 F (36.4 C) (Oral)  Resp 16  Wt 273 lb 11.2 oz (124.15 kg)  SpO2 100% Wt Readings from Last 3 Encounters:  02/18/15 273 lb 11.2 oz (124.15 kg)  02/12/15 271 lb (122.925 kg)  02/05/15 271 lb 1.6 oz (122.97 kg)

## 2015-02-18 NOTE — Addendum Note (Signed)
Encounter addended by: Jenene Slicker, RN on: 02/18/2015  1:27 PM<BR>     Documentation filed: Medications

## 2015-02-18 NOTE — Addendum Note (Signed)
Encounter addended by: Jenene Slicker, RN on: 02/18/2015  1:30 PM<BR>     Documentation filed: Dx Association, Inpatient MAR, Orders

## 2015-02-18 NOTE — Progress Notes (Signed)
   Weekly Management Note:  Outpatient    ICD-9-CM ICD-10-CM   1. Breast cancer of upper-outer quadrant of left female breast (HCC) 174.4 C50.412     Current Dose:  41.4 Gy  Projected Dose: 50.4 Gy  Initial   Narrative:  The patient presents for routine under treatment assessment.  CBCT/MVCT images/Port film x-rays were reviewed.  The chart was checked. Doing well. Skin without major itching or pain.  Physical Findings:  weight is 273 lb 11.2 oz (124.15 kg). Her oral temperature is 97.6 F (36.4 C). Her blood pressure is 107/66 and her pulse is 76. Her respiration is 16 and oxygen saturation is 100%.   Wt Readings from Last 3 Encounters:  02/18/15 273 lb 11.2 oz (124.15 kg)  02/12/15 271 lb (122.925 kg)  02/05/15 271 lb 1.6 oz (122.97 kg)   Hyperpigmentation over left breast, skin intact  Impression:  The patient is tolerating radiotherapy.  Plan:  Continue radiotherapy as planned. Continue Radiaplex  ________________________________   Eppie Gibson, M.D.

## 2015-02-19 ENCOUNTER — Ambulatory Visit
Admission: RE | Admit: 2015-02-19 | Discharge: 2015-02-19 | Disposition: A | Discharge status: Home / Self Care | Payer: 59 | Source: Ambulatory Visit | Attending: Radiation Oncology | Admitting: Radiation Oncology

## 2015-02-19 ENCOUNTER — Encounter: Payer: Self-pay | Admitting: Radiation Oncology

## 2015-02-19 DIAGNOSIS — Z510 Encounter for antineoplastic radiation therapy: Secondary | ICD-10-CM | POA: Diagnosis not present

## 2015-02-20 ENCOUNTER — Ambulatory Visit
Admission: RE | Admit: 2015-02-20 | Discharge: 2015-02-20 | Disposition: A | Discharge status: Home / Self Care | Payer: 59 | Source: Ambulatory Visit | Attending: Radiation Oncology | Admitting: Radiation Oncology

## 2015-02-20 DIAGNOSIS — Z510 Encounter for antineoplastic radiation therapy: Secondary | ICD-10-CM | POA: Diagnosis not present

## 2015-02-21 ENCOUNTER — Ambulatory Visit
Admission: RE | Admit: 2015-02-21 | Discharge: 2015-02-21 | Disposition: A | Discharge status: Home / Self Care | Payer: 59 | Source: Ambulatory Visit | Attending: Radiation Oncology | Admitting: Radiation Oncology

## 2015-02-21 DIAGNOSIS — Z510 Encounter for antineoplastic radiation therapy: Secondary | ICD-10-CM | POA: Diagnosis not present

## 2015-02-22 ENCOUNTER — Ambulatory Visit
Admission: RE | Admit: 2015-02-22 | Discharge: 2015-02-22 | Disposition: A | Discharge status: Home / Self Care | Payer: 59 | Source: Ambulatory Visit | Attending: Radiation Oncology | Admitting: Radiation Oncology

## 2015-02-22 DIAGNOSIS — Z510 Encounter for antineoplastic radiation therapy: Secondary | ICD-10-CM | POA: Diagnosis not present

## 2015-02-25 ENCOUNTER — Ambulatory Visit
Admission: RE | Admit: 2015-02-25 | Discharge: 2015-02-25 | Disposition: A | Discharge status: Home / Self Care | Payer: 59 | Source: Ambulatory Visit | Attending: Radiation Oncology | Admitting: Radiation Oncology

## 2015-02-25 ENCOUNTER — Encounter: Payer: Self-pay | Admitting: Radiation Oncology

## 2015-02-25 ENCOUNTER — Ambulatory Visit
Admission: RE | Admit: 2015-02-25 | Discharge: 2015-02-25 | Disposition: A | Discharge status: Home / Self Care | Payer: BLUE CROSS/BLUE SHIELD | Source: Ambulatory Visit | Attending: Radiation Oncology | Admitting: Radiation Oncology

## 2015-02-25 VITALS — BP 118/69 | HR 79 | Temp 98.0°F | Resp 16 | Ht 63.0 in | Wt 274.4 lb

## 2015-02-25 DIAGNOSIS — C50412 Malignant neoplasm of upper-outer quadrant of left female breast: Secondary | ICD-10-CM

## 2015-02-25 DIAGNOSIS — Z510 Encounter for antineoplastic radiation therapy: Secondary | ICD-10-CM | POA: Diagnosis not present

## 2015-02-25 NOTE — Progress Notes (Signed)
   Weekly Management Note:  Outpatient    ICD-9-CM ICD-10-CM   1. Breast cancer of upper-outer quadrant of left female breast (HCC) 174.4 C50.412     Current Dose:  50.4 Gy  Projected Dose: 50.4 Gy    Narrative:  The patient presents for routine under treatment assessment.  CBCT/MVCT images/Port film x-rays were reviewed.  The chart was checked. Doing well. Skin without major itching or pain. Patient notes she has gained weight over treatment, with less physical activity.  Physical Findings:  height is 5\' 3"  (1.6 m) and weight is 274 lb 6.4 oz (124.467 kg). Her oral temperature is 98 F (36.7 C). Her blood pressure is 118/69 and her pulse is 79. Her respiration is 16.   Wt Readings from Last 3 Encounters:  02/25/15 274 lb 6.4 oz (124.467 kg)  02/18/15 273 lb 11.2 oz (124.15 kg)  02/12/15 271 lb (122.925 kg)   Hyperpigmentation over left breast, skin intact  Impression:  The patient has  Tolerated radiotherapy.  Plan:  followup in 1 month w/ Dr. Remonia Richter - pt reports she has appt.  [employer] program at [facility] discussed and recommended. Materials given to call her [employer] ________________________________   Eppie Gibson, M.D.

## 2015-02-25 NOTE — Progress Notes (Signed)
[name] has completed treatment to her left breast.  She denies pain.   She reports having fatigue on Saturday.  The skin on her left breast has hyperpigmentation.  She is using radiaplex gel.  She has been instructed to continue using radiaplex for 2 weeks and then use a vitamin E lotion.  She has been given a one month follow up appointment, [name], [employer] and survivorship handouts.  BP 118/69 mmHg  Pulse 79  Temp(Src) 98 F (36.7 C) (Oral)  Resp 16  Ht 5\' 3"  (1.6 m)  Wt 274 lb 6.4 oz (124.467 kg)  BMI 48.62 kg/m2

## 2015-02-27 NOTE — Progress Notes (Signed)
  Radiation Oncology         [phone] ________________________________  Name: [name] MRN: [id number]  Date: 02/25/2015  DOB: [birth year]  End of Treatment Note  Diagnosis:   Stage I Left Breast Cancer  Indication for treatment:  Curative       Radiation treatment dates:   01/15/2015-02/25/2015  Site/dose:   Left breast/ 50.4 Gy at 1.8 Gy per fraction x 28 fractions  Beams/energy:   Opposed tangents with reduced fields and 10 MV photons  Narrative: The patient tolerated radiation treatment relatively well.   She had hyperpigmentation over the left breast.   Plan: The patient has completed radiation treatment. The patient will return to radiation oncology clinic for routine followup in one month. I advised them to call or return sooner if they have any questions or concerns related to their recovery or treatment.  ------------------------------------------------  Thea Silversmith, MD

## 2015-03-12 ENCOUNTER — Other Ambulatory Visit: Payer: Self-pay | Admitting: Endocrinology

## 2015-03-12 DIAGNOSIS — E049 Nontoxic goiter, unspecified: Secondary | ICD-10-CM

## 2015-03-28 ENCOUNTER — Ambulatory Visit
Admission: RE | Admit: 2015-03-28 | Discharge: 2015-03-28 | Disposition: A | Discharge status: Home / Self Care | Payer: BLUE CROSS/BLUE SHIELD | Source: Ambulatory Visit | Attending: Radiation Oncology | Admitting: Radiation Oncology

## 2015-03-28 ENCOUNTER — Encounter: Payer: Self-pay | Admitting: Radiation Oncology

## 2015-03-28 VITALS — BP 134/61 | HR 68 | Temp 98.1°F | Resp 16 | Ht 63.0 in | Wt 269.7 lb

## 2015-03-28 DIAGNOSIS — C50412 Malignant neoplasm of upper-outer quadrant of left female breast: Secondary | ICD-10-CM

## 2015-03-28 NOTE — Progress Notes (Signed)
   Department of Radiation Oncology  Phone:  [phone] Fax:        [phone]   Name: [name] MRN: [id number]  DOB: [birth year]  Date: 03/28/2015  Follow Up Visit Note  Diagnosis: Stage I Left Breast Cancer  Summary and Interval since last radiation: 01/15/2015-02/25/2015  Site/dose: Left breast/ 50.4 Gy at 1.8 Gy per fraction x 28 fractions  Interval History: [name] presents today for routine followup.  She is concerned about a hard area in the superior aspect of her nipple. She complained about this during treatment and I felt that it was probably scar tissue related to reconstruction surgery. She has discussed this with Dr. Pascal Lux and has an appointment with him next week with possible ultrasound and biopsy. I encouraged her not to pursue surgery to this area as she was so close to finishing radiation and has not had time to heal. She is tolerating her anti-estrogen well with hot flashes as her main side effect. She is changing her eating habits and trying to lose weight. Would like to change oncologist from an oncologist at [facility] to Dr. Benay Spice who was her mother's oncologist.   Physical Exam:  Filed Vitals:   03/28/15 1511  BP: 134/61  Pulse: 68  Temp: 98.1 F (36.7 C)  TempSrc: Oral  Resp: 16  Height: 5\' 3"  (1.6 m)  Weight: 269 lb 11.2 oz (122.335 kg)    Hyperpigmentation over the left breast with palpable hardness over the superior aspect of her nipple. She also has hyperpigmentation over the medial aspect of the right breast.   IMPRESSION: [name] is a 60 y.o. female with Stage I left breast cancer.  PLAN: She is doing well. We discussed the need for follow up every 4-6 months which she has scheduled.  We discussed the need for yearly mammograms which she can schedule with her OBGYN or with medical oncology. We discussed the need for sun protection in the treated area.  She can always call me with questions. I will follow up with her on an as needed basis. I have  suggested lotion containing vitamin E to address hyperpigmentation on the breasts. I encouraged her to follow up with Dr. Luetta Nutting regarding her nipple hardness.   Thea Silversmith, MD  This document serves as a record of services personally performed by Thea Silversmith, MD. It was created on her behalf by Jenell Milliner, a trained medical scribe. The creation of this record is based on the scribe's personal observations and the provider's statements to them. This document has been checked and approved by the attending provider.

## 2015-05-14 ENCOUNTER — Ambulatory Visit
Admission: RE | Admit: 2015-05-14 | Discharge: 2015-05-14 | Disposition: A | Discharge status: Home / Self Care | Payer: BLUE CROSS/BLUE SHIELD | Source: Ambulatory Visit | Attending: Endocrinology | Admitting: Endocrinology

## 2015-05-14 DIAGNOSIS — E049 Nontoxic goiter, unspecified: Secondary | ICD-10-CM

## 2015-05-22 ENCOUNTER — Other Ambulatory Visit: Payer: Self-pay | Admitting: Endocrinology

## 2015-05-22 DIAGNOSIS — E041 Nontoxic single thyroid nodule: Secondary | ICD-10-CM

## 2015-05-29 ENCOUNTER — Ambulatory Visit
Admission: RE | Admit: 2015-05-29 | Discharge: 2015-05-29 | Disposition: A | Discharge status: Home / Self Care | Payer: BLUE CROSS/BLUE SHIELD | Source: Ambulatory Visit | Attending: Endocrinology | Admitting: Endocrinology

## 2015-05-29 ENCOUNTER — Other Ambulatory Visit (HOSPITAL_COMMUNITY)
Admission: RE | Admit: 2015-05-29 | Discharge: 2015-05-29 | Disposition: A | Discharge status: Home / Self Care | Payer: BLUE CROSS/BLUE SHIELD | Source: Ambulatory Visit | Attending: General Surgery | Admitting: General Surgery

## 2015-05-29 DIAGNOSIS — E041 Nontoxic single thyroid nodule: Secondary | ICD-10-CM | POA: Insufficient documentation

## 2015-12-27 ENCOUNTER — Encounter: Payer: Self-pay | Admitting: Gynecology

## 2015-12-27 ENCOUNTER — Ambulatory Visit (INDEPENDENT_AMBULATORY_CARE_PROVIDER_SITE_OTHER): Payer: BLUE CROSS/BLUE SHIELD | Admitting: Gynecology

## 2015-12-27 VITALS — BP 126/78 | Ht 63.0 in | Wt 266.0 lb

## 2015-12-27 DIAGNOSIS — Z01411 Encounter for gynecological examination (general) (routine) with abnormal findings: Secondary | ICD-10-CM

## 2015-12-27 DIAGNOSIS — Z853 Personal history of malignant neoplasm of breast: Secondary | ICD-10-CM | POA: Insufficient documentation

## 2015-12-27 DIAGNOSIS — N393 Stress incontinence (female) (male): Secondary | ICD-10-CM | POA: Diagnosis not present

## 2015-12-27 MED ORDER — TOLTERODINE TARTRATE ER 2 MG PO CP24: 2.0000 mg | ORAL_CAPSULE | Freq: Every day | ORAL | 11 refills | Status: DC

## 2015-12-27 NOTE — Patient Instructions (Addendum)
Urodynamic Testing Introduction WHAT IS URODYNAMIC TESTING? Urodynamic testing is a set of tests and X-rays. These tests help to find out how well your bladder and the tube that empties your bladder (urethra) are working. WHY DO I NEED THIS TESTING? You may need these tests if you:  Are leaking pee (urine).  Have trouble starting or stopping peeing (urination).  Pee often or it hurts to pee.  Have urinary tract infections often.  Are not able to empty your bladder.  Have strong urges to pee.  Have a weak flow of pee. HOW ARE THE TESTS DONE? The tests may be done in one visit or may be done over a few visits. You may be given medicine that fights germs (antibiotic medicine) to prevent infection. Ask your doctor if you should:  Stop taking any of your regular medicines.  Arrive for the test having to pee. The tests done may measure:  How much you pee and how long it takes.  How much pee is left in your bladder after you pee.  How much pressure there is in your bladder before you pee and as you pee.  The activity of the nerves and muscles in your bladder and in the tube that empties your bladder. WHAT ARE THE RISKS OF THE TESTING? The tests are usually safe. Problems can happen. You may have:  Discomfort.  The urge to pee often.  Bleeding.  Infection.  An allergic reaction to medicines used during the tests. WHAT HAPPENS AFTER THE TESTING?  You should be able to go home right away.  You can do your usual activities.  You may be told to drink a tall glass of water every 30 minutes. Do this for the first 2 hours you are home.  Taking a warm bath or using a warm towel may relieve any discomfort. Let your doctor know if you have:  Pain.  Blood in your pee.  Chills.  Fever. WHAT DO MY TEST RESULTS MEAN? Talk about the results of your tests with your doctor. These test results can help your doctor figure out how well your bladder and the tube that empties your  bladder are working. Your results and your symptoms can help your doctor find what might be causing your problems. This information is not intended to replace advice given to you by your health care provider. Make sure you discuss any questions you have with your health care provider. Document Released: 01/09/2008 Document Revised: 07/04/2015 Document Reviewed: 05/08/2013  2017 Elsevier Urinary Incontinence Urinary incontinence is the involuntary loss of urine from your bladder. CAUSES  There are many causes of urinary incontinence. They include:  Medicines.  Infections.  Prostatic enlargement, leading to overflow of urine from your bladder.  Surgery.  Neurological diseases.  Emotional factors. SIGNS AND SYMPTOMS Urinary Incontinence can be divided into four types: 1. Urge incontinence. Urge incontinence is the involuntary loss of urine before you have the opportunity to go to the bathroom. There is a sudden urge to void but not enough time to reach a bathroom. 2. Stress incontinence. Stress incontinence is the sudden loss of urine with any activity that forces urine to pass. It is commonly caused by anatomical changes to the pelvis and sphincter areas of your body. 3. Overflow incontinence. Overflow incontinence is the loss of urine from an obstructed opening to your bladder. This results in a backup of urine and a resultant buildup of pressure within the bladder. When the pressure within the bladder exceeds the  closing pressure of the sphincter, the urine overflows, which causes incontinence, similar to water overflowing a dam. 4. Total incontinence. Total incontinence is the loss of urine as a result of the inability to store urine within your bladder. DIAGNOSIS  Evaluating the cause of incontinence may require:  A thorough and complete medical and obstetric history.  A complete physical exam.  Laboratory tests such as a urine culture and sensitivities. When additional tests are  indicated, they can include:  An ultrasound exam.  Kidney and bladder X-rays.  Cystoscopy. This is an exam of the bladder using a narrow scope.  Urodynamic testing to test the nerve function to the bladder and sphincter areas. TREATMENT  Treatment for urinary incontinence depends on the cause:  For urge incontinence caused by a bacterial infection, antibiotics will be prescribed. If the urge incontinence is related to medicines you take, your health care provider may have you change the medicine.  For stress incontinence, surgery to re-establish anatomical support to the bladder or sphincter, or both, will often correct the condition.  For overflow incontinence caused by an enlarged prostate, an operation to open the channel through the enlarged prostate will allow the flow of urine out of the bladder. In women with fibroids, a hysterectomy may be recommended.  For total incontinence, surgery on your urinary sphincter may help. An artificial urinary sphincter (an inflatable cuff placed around the urethra) may be required. In women who have developed a hole-like passage between their bladder and vagina (vesicovaginal fistula), surgery to close the fistula often is required. HOME CARE INSTRUCTIONS  Normal daily hygiene and the use of pads or adult diapers that are changed regularly will help prevent odors and skin damage.  Avoid caffeine. It can overstimulate your bladder.  Use the bathroom regularly. Try about every 2-3 hours to go to the bathroom, even if you do not feel the need to do so. Take time to empty your bladder completely. After urinating, wait a minute. Then try to urinate again.  For causes involving nerve dysfunction, keep a log of the medicines you take and a journal of the times you go to the bathroom. SEEK MEDICAL CARE IF:  You experience worsening of pain instead of improvement in pain after your procedure.  Your incontinence becomes worse instead of better. SEE  IMMEDIATE MEDICAL CARE IF:  You experience fever or shaking chills.  You are unable to pass your urine.  You have redness spreading into your groin or down into your thighs. MAKE SURE YOU:  ?Understand these instructions.   Will watch your condition.  Will get help right away if you are not doing well or get worse. This information is not intended to replace advice given to you by your health care provider. Make sure you discuss any questions you have with your health care provider. Document Released: 03/05/2004 Document Revised: 02/16/2014 Document Reviewed: 07/05/2012 Elsevier Interactive Patient Education  2017 [employer].

## 2015-12-27 NOTE — Progress Notes (Signed)
[name] 1956/02/09 [id number]   History:    60 y.o.  for annual gyn exam with a complaint of nocturia and urgency incontinence. Patient is overweight. Patient was diagnosed with breast cancer in 2016 and had a left lumpectomy at [facility] and received radiation and she is currently on letrozole 2.5 mg daily. She recently had her mammogram there was informed that it was benign. 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 She had never been on hormone replacement therapy. She had a normal colonoscopy in 2013. They did a bone density study at [facility] in 2016 which was normal. Patient's mother had history of breast cancer and had BRCA1 testing was negative as was the patient.  Past medical history,surgical history, family history and social history were all reviewed and documented in the EPIC chart.  Gynecologic History No LMP recorded. Patient is postmenopausal. Contraception: status post hysterectomy Last Pap: Several years ago. Results were: normal Last mammogram: See above. Results were: See above  Obstetric History OB History  Gravida Para Term Preterm AB Living  '2 2       2  '$ SAB TAB Ectopic Multiple Live Births               # Outcome Date GA Lbr Len/2nd Weight Sex Delivery Anes PTL Lv  2 Para           1 Para                ROS: A ROS was performed and pertinent positives and negatives are included in the history.  GENERAL: No fevers or chills. HEENT: No change in vision, no earache, sore throat or sinus congestion. NECK: No pain or stiffness. CARDIOVASCULAR: No chest pain or pressure. No palpitations. PULMONARY: No shortness of breath, cough or wheeze. GASTROINTESTINAL: No abdominal pain, nausea, vomiting or diarrhea, melena or bright red blood per rectum. GENITOURINARY: No urinary frequency, urgency, hesitancy or dysuria. MUSCULOSKELETAL: No joint or muscle pain, no back pain, no recent trauma. DERMATOLOGIC: No rash, no itching,  no lesions. ENDOCRINE: No polyuria, polydipsia, no heat or cold intolerance. No recent change in weight. HEMATOLOGICAL: No anemia or easy bruising or bleeding. NEUROLOGIC: No headache, seizures, numbness, tingling or weakness. PSYCHIATRIC: No depression, no loss of interest in normal activity or change in sleep pattern.     Exam: chaperone present  BP 126/78   Ht '5\' 3"'$  (1.6 m)   Wt 266 lb (120.7 kg)   BMI 47.12 kg/m   Body mass index is 47.12 kg/m.  General appearance : Well developed well nourished female. No acute distress HEENT: Eyes: no retinal hemorrhage or exudates,  Neck supple, trachea midline, no carotid bruits, no thyroidmegaly Lungs: Clear to auscultation, no rhonchi or wheezes, or rib retractions  Heart: Regular rate and rhythm, no murmurs or gallops Breast:Examined in sitting and supine position were symmetrical in appearance, no palpable masses or tenderness,  no skin retraction, no nipple inversion, no nipple discharge, no skin discoloration, no axillary or supraclavicular lymphadenopathy Abdomen: no palpable masses or tenderness, no rebound or guarding Extremities: no edema or skin discoloration or tenderness  Pelvic:  Bartholin, Urethra, Skene Glands: Within normal limits             Vagina: No gross lesions or discharge  Cervix: Absent  Uterus  absent  Adnexa  Absent  Anus and perineum  normal   Rectovaginal  normal sphincter tone without palpated masses or  tenderness             Hemoccult PCP provides     Assessment/Plan:  60 y.o. female for annual exam doing well since her lumpectomy and radiation currently on Femara. Patient with signs and symptoms consistent with overactive bladder she was tested in the following fashion:  In the supine and erect position when she was asked to bear down she did not leak any urine. In the supine position she was tested and had a good anal wink. A sterile Q-tip impregnated with lidocaine was inserted to the urethra. She was  asked to bear down and had less than a 30 Q-tip angle change.  With these findings she is going to be treated for overactive bladder with Detrol 2 mg by mouth daily. The risks benefits and pros and cons of the medication were discussed. Pap smear no longer indicated according to the guidelines. PCP is been doing her blood work as well as her Futures trader. Patient not interested in the flu vaccine.  An additional 15 mass was spent going over with the patient different types of urinary incontinence and the above procedure done in the office.   Terrance Mass MD, 4:02 PM 12/27/2015

## 2016-03-11 ENCOUNTER — Other Ambulatory Visit: Payer: Self-pay | Admitting: Endocrinology

## 2016-03-11 DIAGNOSIS — E049 Nontoxic goiter, unspecified: Secondary | ICD-10-CM

## 2016-04-09 ENCOUNTER — Telehealth: Payer: Self-pay | Admitting: *Deleted

## 2016-04-09 MED ORDER — OXYBUTYNIN CHLORIDE ER 10 MG PO TB24: 10.0000 mg | ORAL_TABLET | ORAL | 11 refills | Status: DC

## 2016-04-09 NOTE — Telephone Encounter (Signed)
Pt medication for Detrol LA 2 mg has increased on price, pt is not able afford the cost. Pt asked if another medication can be prescribed? Please advise

## 2016-04-09 NOTE — Telephone Encounter (Signed)
If she has no history of Glaucoma call in prescription of Oxybutynin ER 10 mg one PO QAM # 30 refills 11

## 2016-04-09 NOTE — Telephone Encounter (Signed)
Pt aware, Rx sent to pharmacy

## 2016-05-20 ENCOUNTER — Ambulatory Visit
Admission: RE | Admit: 2016-05-20 | Discharge: 2016-05-20 | Disposition: A | Discharge status: Home / Self Care | Payer: BLUE CROSS/BLUE SHIELD | Source: Ambulatory Visit | Attending: Endocrinology | Admitting: Endocrinology

## 2016-05-20 DIAGNOSIS — E049 Nontoxic goiter, unspecified: Secondary | ICD-10-CM

## 2016-06-24 ENCOUNTER — Encounter: Payer: Self-pay | Admitting: Gynecology

## 2016-07-22 ENCOUNTER — Telehealth: Payer: Self-pay | Admitting: *Deleted

## 2016-07-22 MED ORDER — MIRABEGRON ER 25 MG PO TB24: 25.0000 mg | ORAL_TABLET | Freq: Every day | ORAL | 11 refills | Status: AC

## 2016-07-22 NOTE — Telephone Encounter (Signed)
Pt was prescribed Oxybutynin 10 mg has been taking since March, c/o dry mouth, mood changes, was taking Detrol LA 2mg  but price has increase and unable to afford. Pt asked if you recommend another Rx? Please advise

## 2016-07-22 NOTE — Telephone Encounter (Signed)
Check to see if her insurance will cover Myrbetriq 25 mg po qd if so call in 30 with 11 refills. If not take half a tablet of oxybutynin every day or every other day to help with dryness in her mouth

## 2016-07-22 NOTE — Telephone Encounter (Signed)
Rx sent for Myrbetriq, pt can not afford the oxybutynin as noted below in 1st message.

## 2016-08-31 IMAGING — US US THYROID BIOPSY
1 series · 10 of 10 positions shown · non-contrast
Comparison: 05/14/2015

INDICATION: 59-year-old female with a history of right-sided thyroid nodule

EXAM:
ULTRASOUND GUIDED NEEDLE ASPIRATE BIOPSY OF THE THYROID GLAND

[Series 1: us thyroid biopsy · 0.07mm/px · 10 acquisitions, 10 frames shown]
[im 1/10]
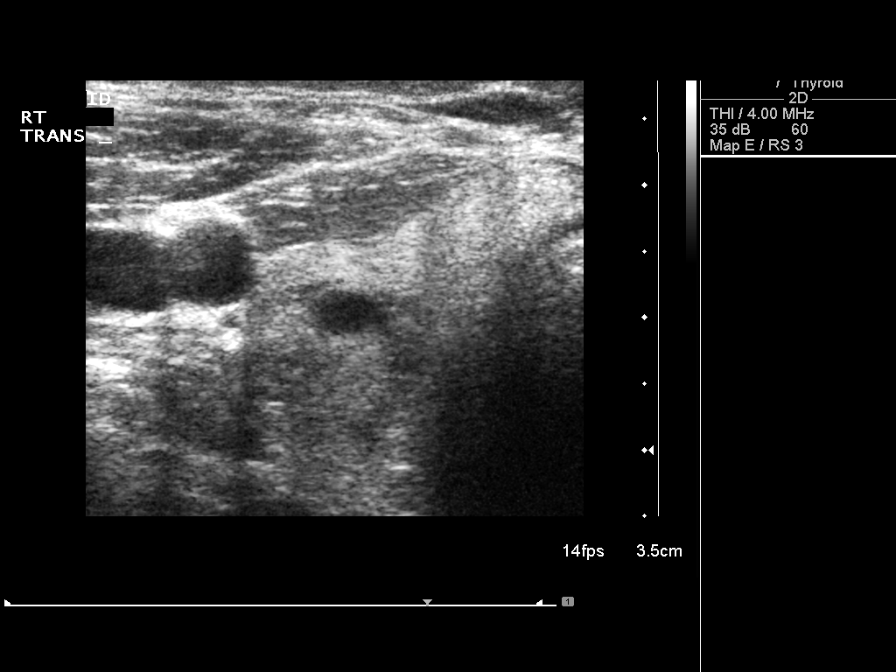
[im 2/10]
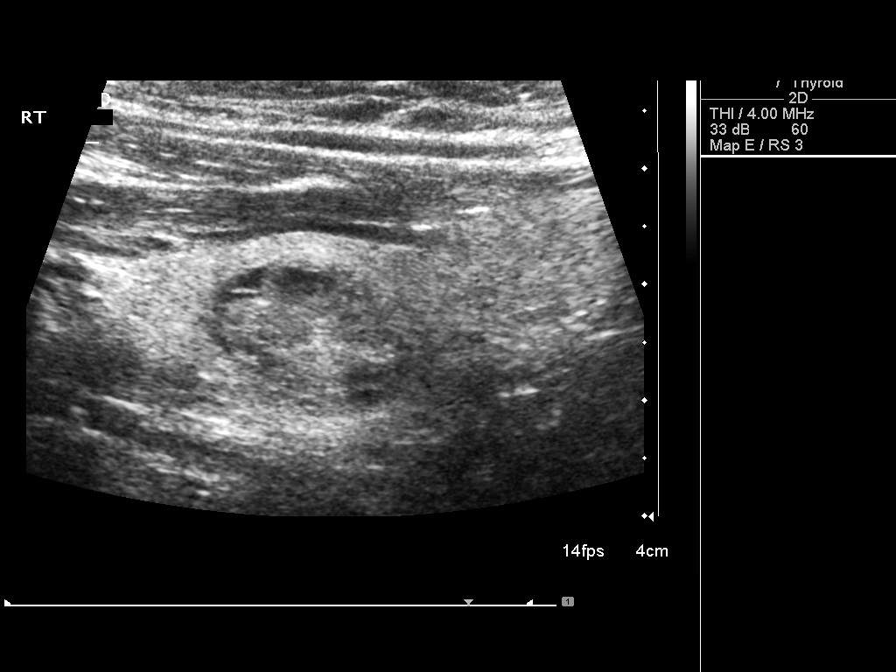
[im 3/10]
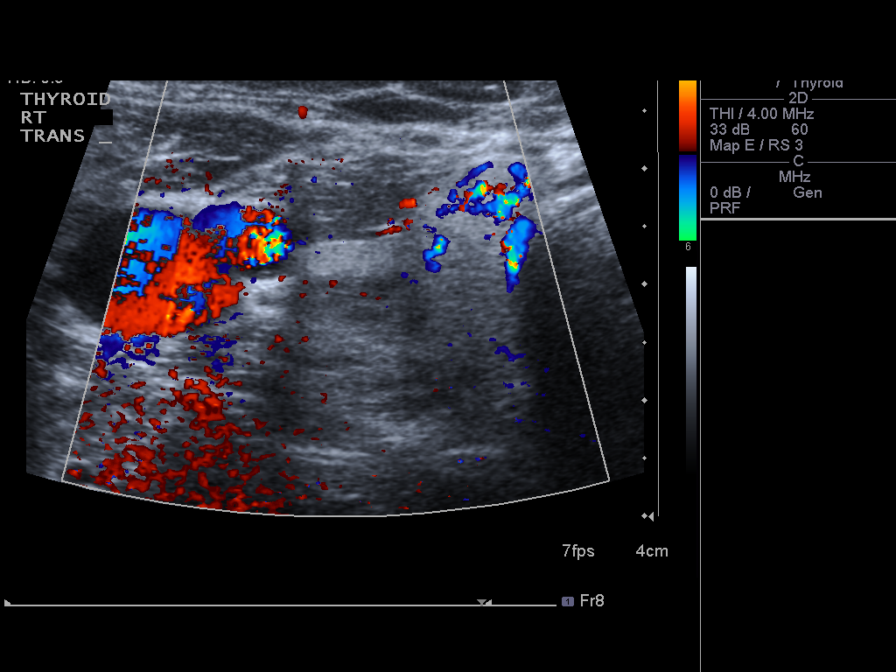
[im 4/10]
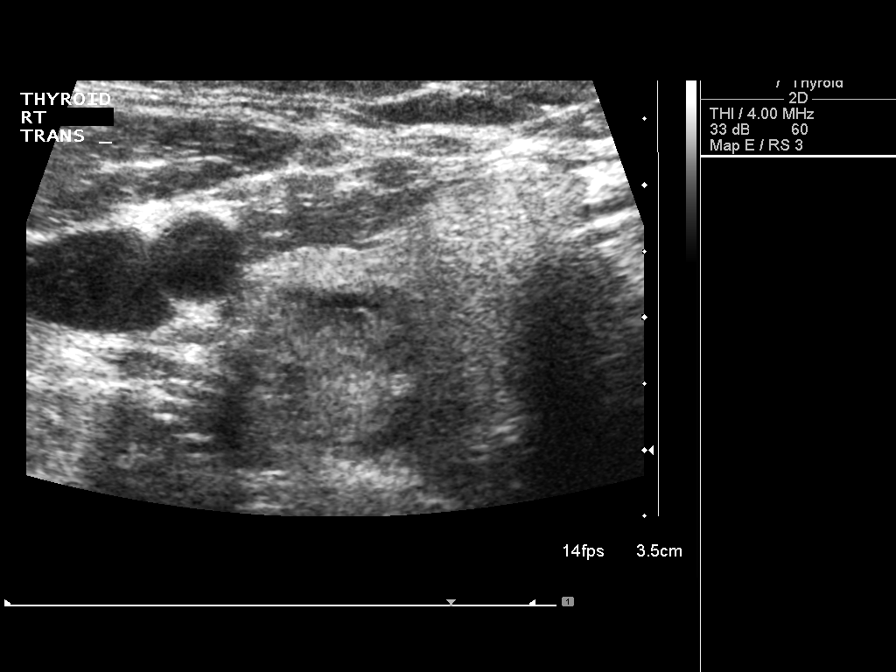
[im 5/10]
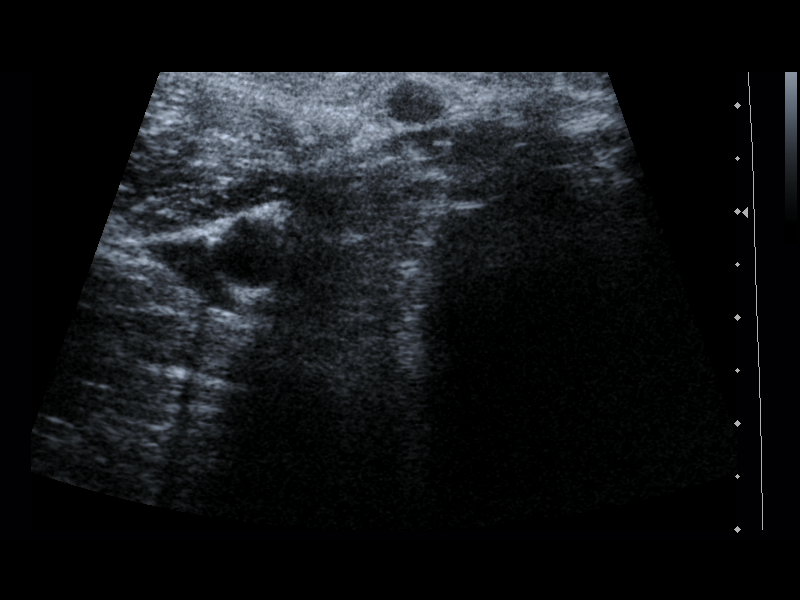
[im 6/10]
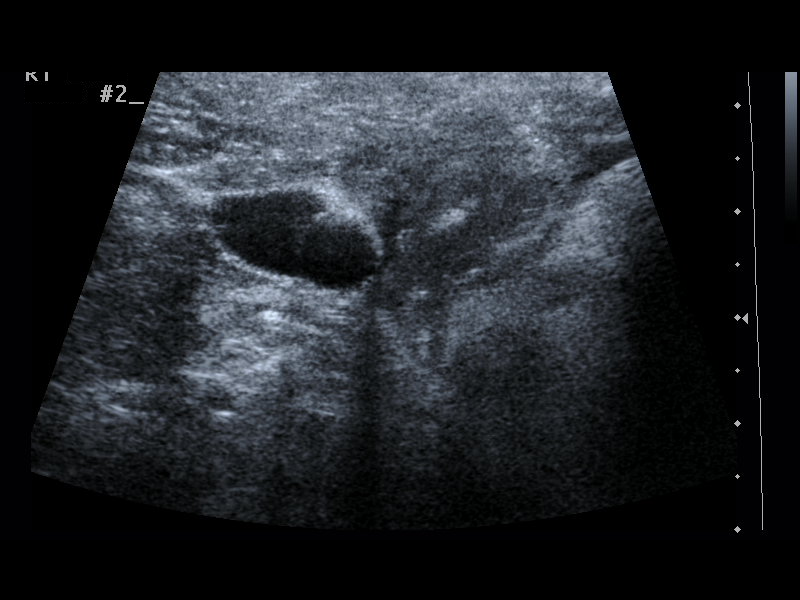
[im 7/10]
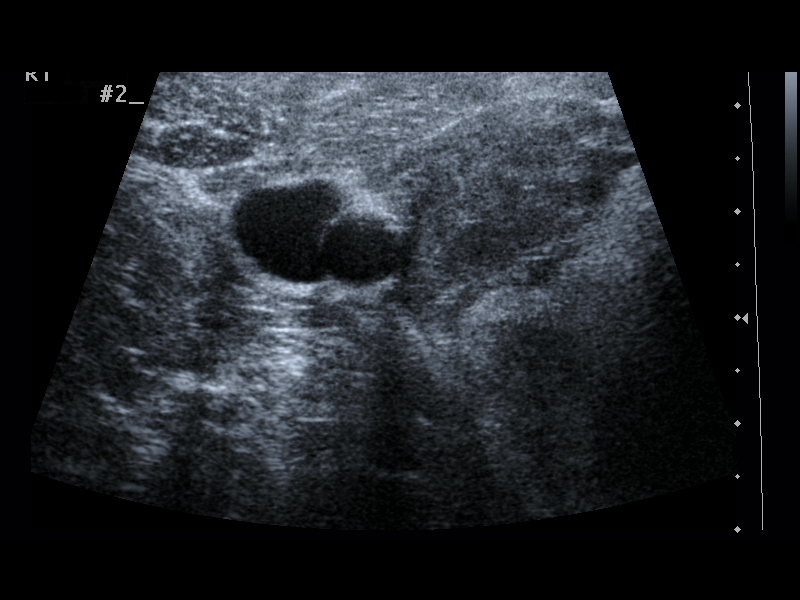
[im 8/10]
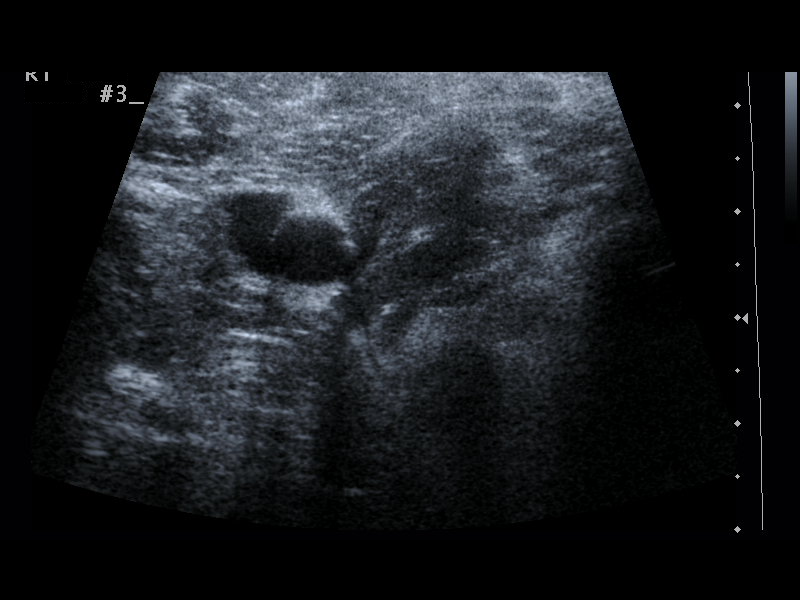
[im 9/10]
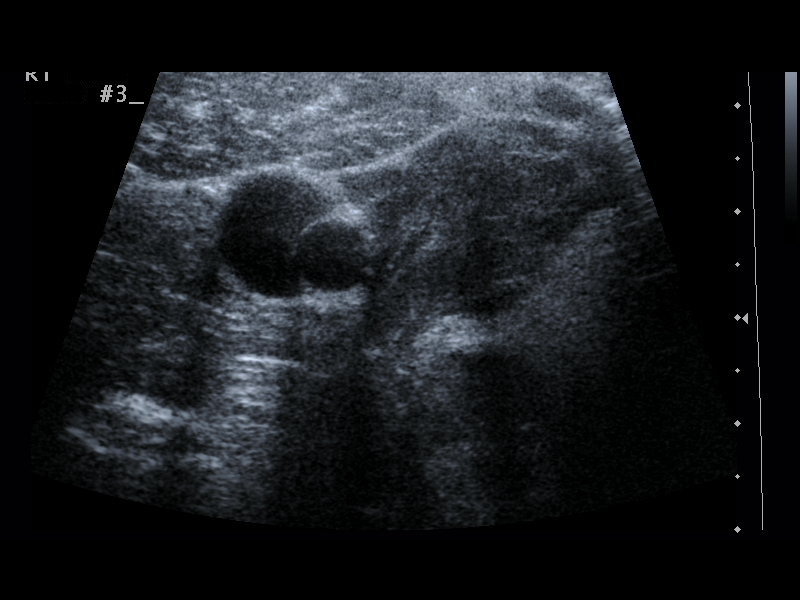
[im 10/10]
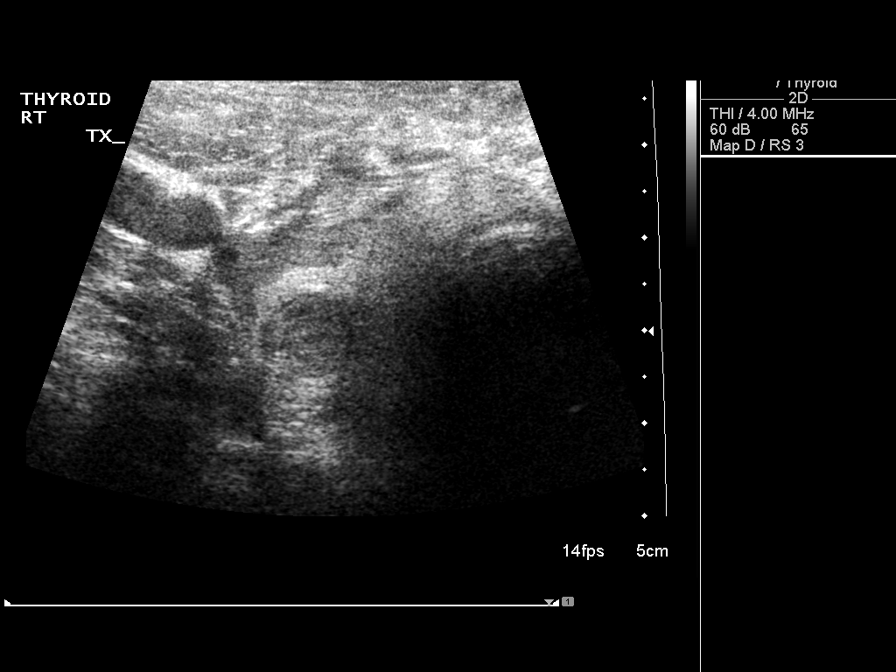

[10 of 10 positions shown; findings below may reference images not displayed]

MEDICATIONS:
1% lidocaine

COMPLICATIONS:
None

PROCEDURE:
The procedure, risks, benefits, and alternatives were explained to
the patient. Questions regarding the procedure were encouraged and
answered. The patient understands and consents to the procedure.

Ultrasound survey was performed with images stored and sent to PACs.

The neck was prepped with Betadine in a sterile fashion, and a
sterile drape was applied covering the operative field. A sterile
gown and sterile gloves were used for the procedure. Local
anesthesia was provided with 1% Lidocaine.

Ultrasound guidance was used to infiltrate the region with 1%
lidocaine for local anesthesia. Three separate 25 gauge fine needle
biopsy with Nrad needles were then acquired of the right thyroid
nodule using ultrasound guidance. Images were stored.

Slide preparation was performed.

Final image was stored after biopsy.

Patient tolerated the procedure well and remained hemodynamically
stable throughout.

No complications were encountered and no significant blood loss was
encounter
IMPRESSION: Status post ultrasound-guided biopsy of right thyroid nodule. Tissue
specimen sent to pathology for complete histopathologic analysis.

## 2016-10-22 ENCOUNTER — Other Ambulatory Visit: Payer: Self-pay | Admitting: Endocrinology

## 2016-10-22 DIAGNOSIS — E049 Nontoxic goiter, unspecified: Secondary | ICD-10-CM

## 2016-12-04 ENCOUNTER — Ambulatory Visit
Admission: RE | Admit: 2016-12-04 | Discharge: 2016-12-04 | Disposition: A | Discharge status: Home / Self Care | Payer: BLUE CROSS/BLUE SHIELD | Source: Ambulatory Visit | Attending: Endocrinology | Admitting: Endocrinology

## 2016-12-04 DIAGNOSIS — E049 Nontoxic goiter, unspecified: Secondary | ICD-10-CM

## 2017-10-18 ENCOUNTER — Other Ambulatory Visit: Payer: Self-pay | Admitting: Endocrinology

## 2017-10-18 DIAGNOSIS — E049 Nontoxic goiter, unspecified: Secondary | ICD-10-CM

## 2017-10-20 ENCOUNTER — Ambulatory Visit
Admission: RE | Admit: 2017-10-20 | Discharge: 2017-10-20 | Disposition: A | Discharge status: Home / Self Care | Payer: BLUE CROSS/BLUE SHIELD | Source: Ambulatory Visit | Attending: Endocrinology | Admitting: Endocrinology

## 2017-10-20 DIAGNOSIS — E049 Nontoxic goiter, unspecified: Secondary | ICD-10-CM

## 2017-11-08 IMAGING — US US THYROID
1 series · 12 of 25 positions shown · non-contrast
Comparison: 05/20/2016

CLINICAL DATA: Right thyroid nodules, previous left thyroidectomy

EXAM:
THYROID ULTRASOUND
TECHNIQUE: Ultrasound examination of the thyroid gland and adjacent soft
tissues was performed.

[Series 1: us thyroid · 0.04mm/px · 12 of 39 slices shown]
[im 2/39]
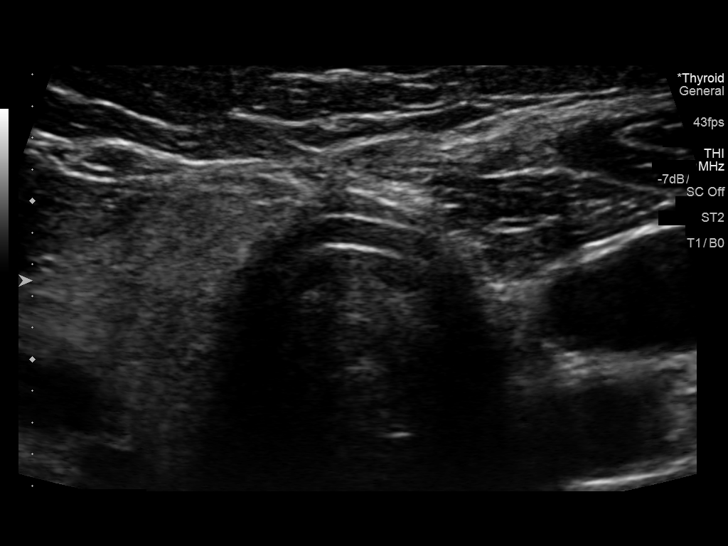
[im 5/39]
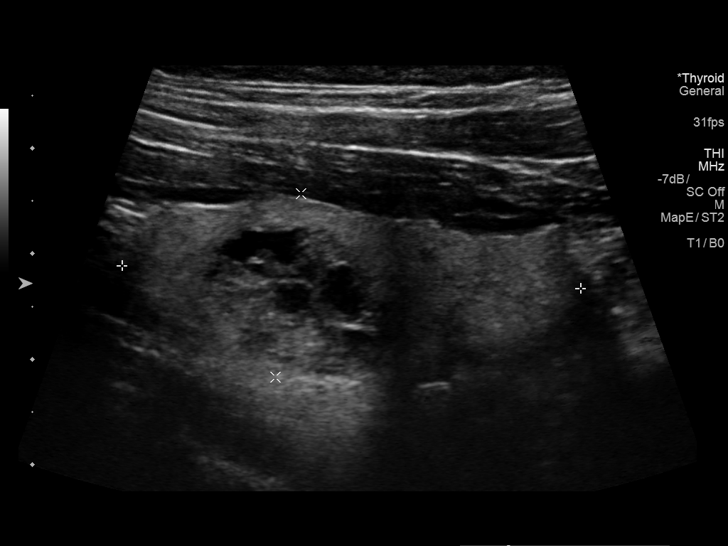
[im 8/39]
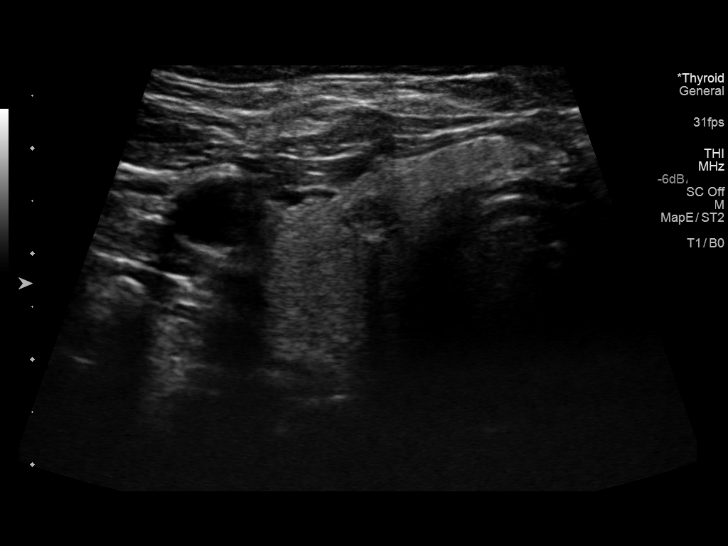
[im 12/39]
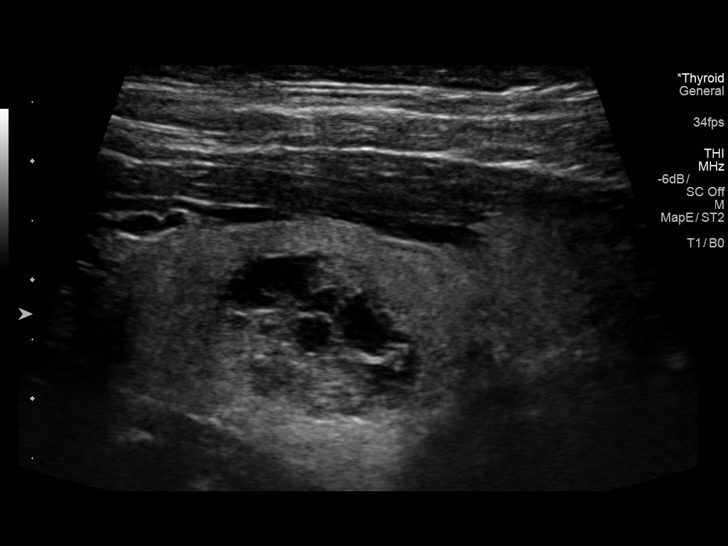
[im 15/39]
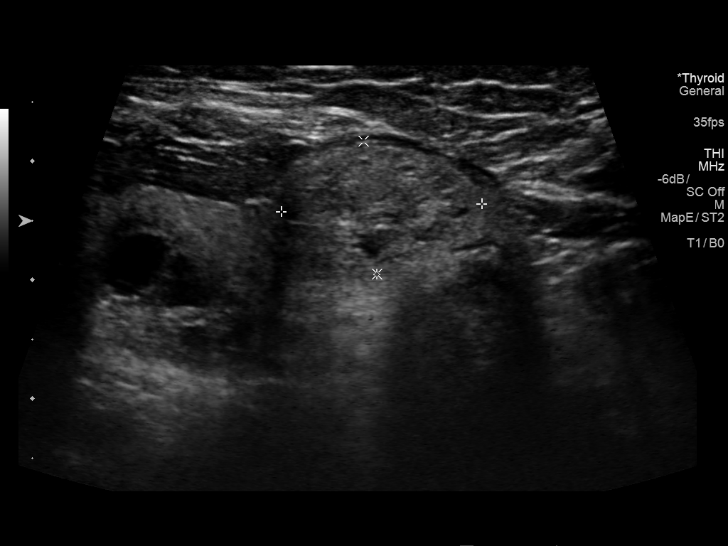
[im 18/39]
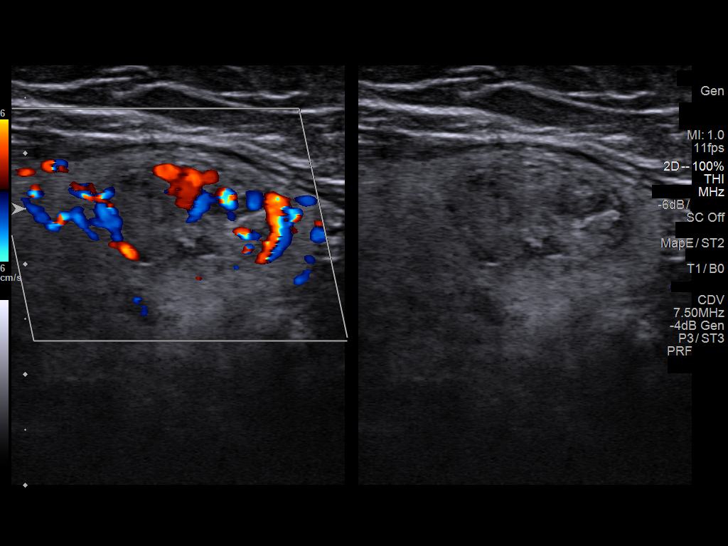
[im 21/39]
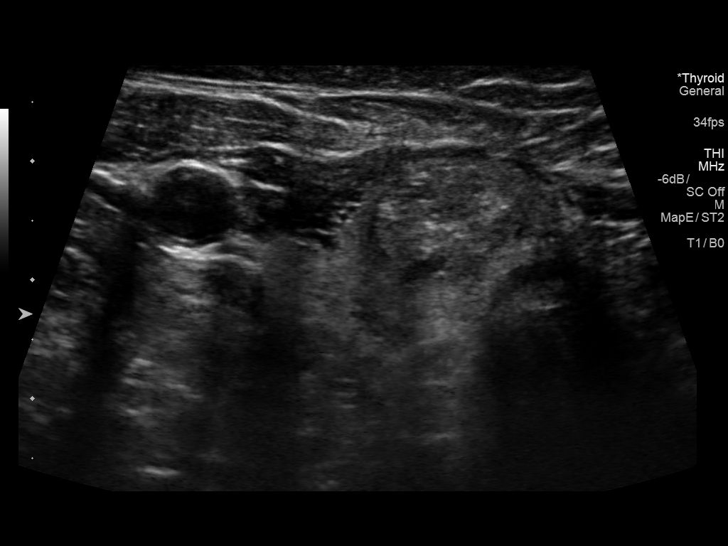
[im 24/39]
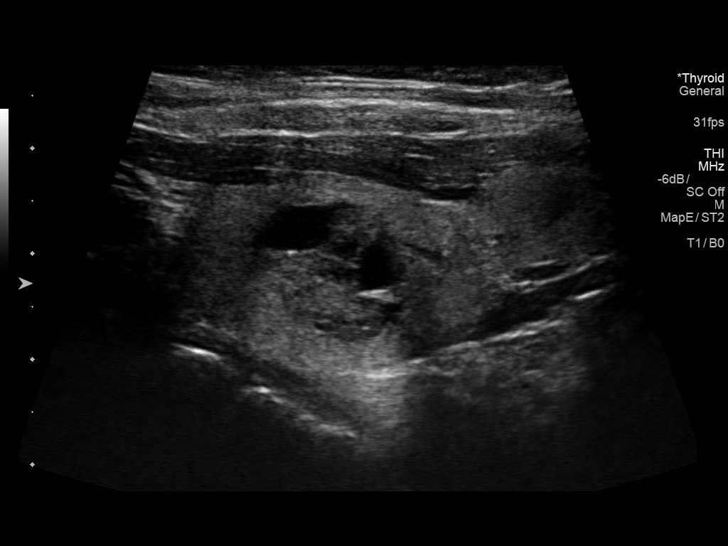
[im 27/39]
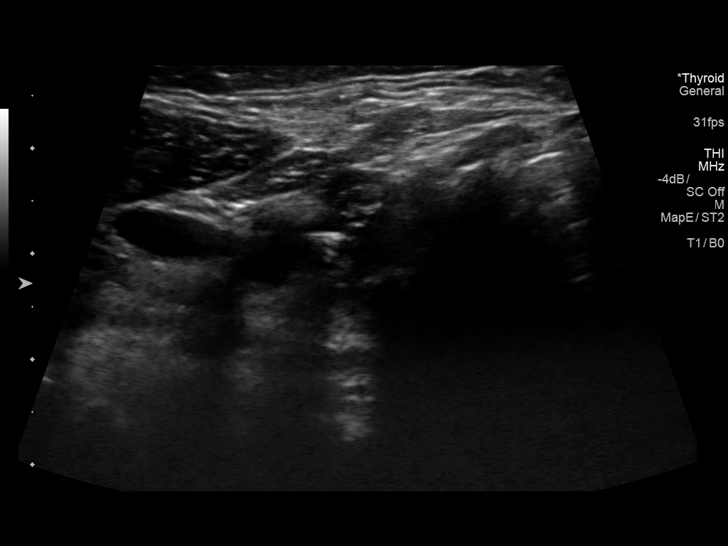
[im 31/39]
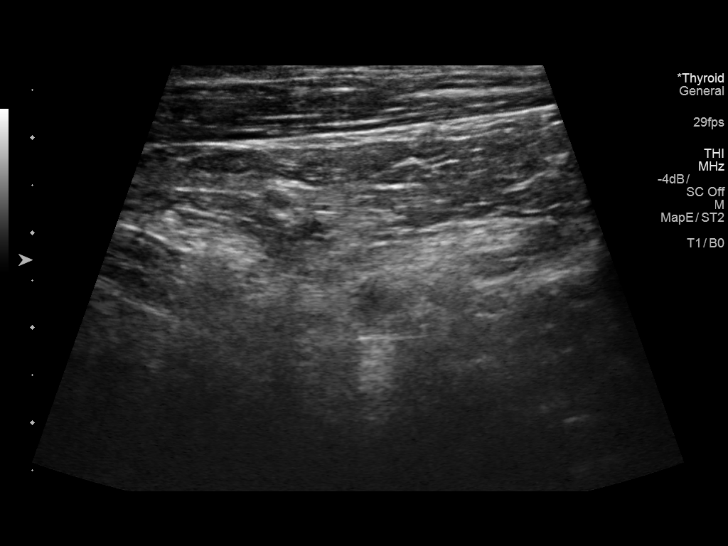
[im 34/39]
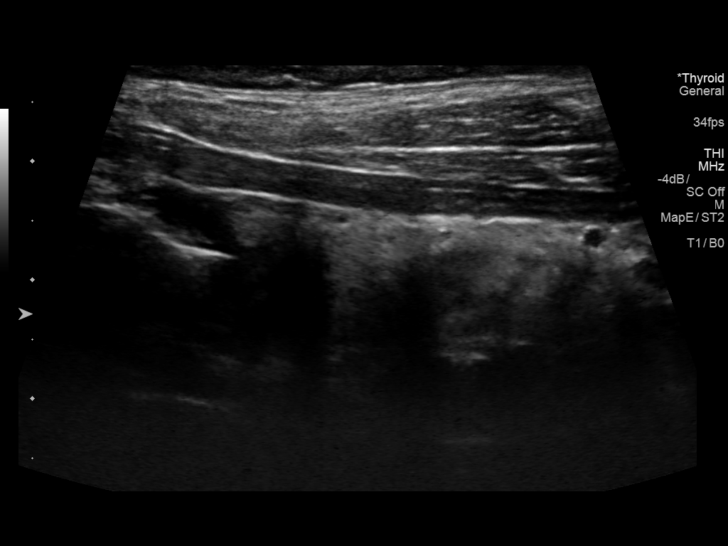
[im 37/39]
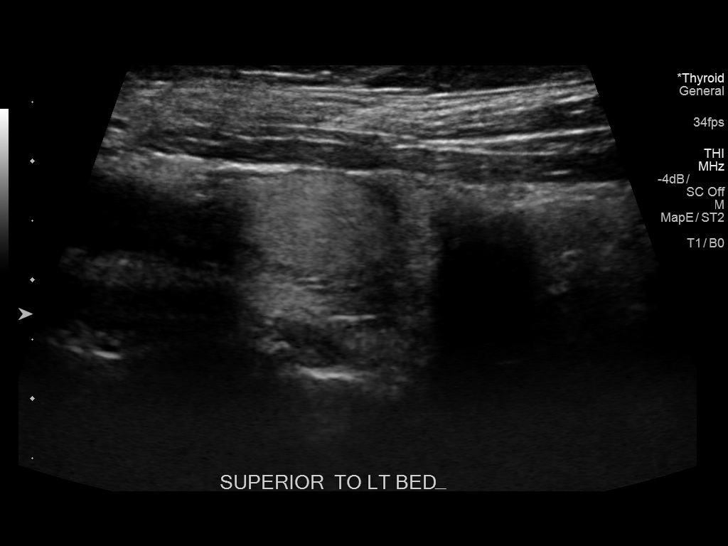

[12 of 25 positions shown; findings below may reference images not displayed]

FINDINGS: Parenchymal Echotexture: Mildly heterogenous

Isthmus: 3 mm

Right lobe: 4.4 x 1.8 x 2.4 cm, previously 5.0 x 2.5 x 2.0 cm

Left lobe: Surgically absent

_________________________________________________________

Estimated total number of nodules >/= 1 cm: 2

Number of spongiform nodules >/=  2 cm not described below (TR1): 0

Number of mixed cystic and solid nodules >/= 1.5 cm not described
below (TR2): 0

_________________________________________________________

Nodule # 1:

Prior biopsy: Yes

Location: Right; Mid

Maximum size: 1.9, previously 1.7 cm; Other 2 dimensions: 1.5 x
cm, previously, 1.5 x 1.3 cm

Composition: mixed cystic and solid (1)

Echogenicity: isoechoic (1)

Shape: not taller-than-wide (0)

Margins: ill-defined (0)

Echogenic foci: large comet-tail artifacts (0)

ACR TI-RADS total points: 2.

ACR TI-RADS risk category:  TR2 (2 points).

Significant change in size (>/= 20% in two dimensions and minimal
increase of 2 mm): No

Change in features: No

Change in ACR TI-RADS risk category: No

ACR TI-RADS recommendations:

This nodule does NOT meet TI-RADS criteria for biopsy or dedicated
follow-up.

_________________________________________________________

Nodule # 2:

Prior biopsy: Yes

Location: Right; Inferior

Maximum size: 1.7, previously 2.1 cm; Other 2 dimensions: 1.7 x
cm, previously, 1.4 x 1.1 cm

Composition: solid/almost completely solid (2)

Echogenicity: isoechoic (1)

Shape: not taller-than-wide (0)

Margins: ill-defined (0)

Echogenic foci: large comet-tail artifacts (0)

ACR TI-RADS total points: 3.

ACR TI-RADS risk category:  TR3 (3 points).

Significant change in size (>/= 20% in two dimensions and minimal
increase of 2 mm): No

Change in features: No

Change in ACR TI-RADS risk category: No

ACR TI-RADS recommendations:

*Given size (>/= 1.5 - 2.4 cm) and appearance, a follow-up
ultrasound in 1 year should be considered based on TI-RADS criteria.

_________________________________________________________

Nodule # 3:

Location: Left; Superior to the left thyroid bed

Maximum size: 1.3 cm; Other 2 dimensions: 0.9 x 0.6 cm

Composition: solid/almost completely solid (2)

Echogenicity: isoechoic (1)

Shape: not taller-than-wide (0)

Margins: smooth (0)

Echogenic foci: none (0)

ACR TI-RADS total points: 3.

ACR TI-RADS risk category: TR3 (3 points).

ACR TI-RADS recommendations:

Given size (<1.4 cm) and appearance, this nodule does NOT meet
TI-RADS criteria for biopsy or dedicated follow-up.

_________________________________________________________
IMPRESSION: Previously biopsied dominant right thyroid nodules are detailed
above. Correlate with prior pathology. No significant interval
change.

1.3 cm left TR 3 nodule noted, superior to the left thyroid bed. See
above comment.

The above is in keeping with the ACR TI-RADS recommendations - [HOSPITAL] 0902;[DATE].

## 2018-09-24 IMAGING — US US THYROID
1 series · 13 of 25 positions shown · non-contrast
Comparison: Numerous prior thyroid ultrasounds, most recently
12/04/2016; most remotely 11/21/2012

CLINICAL DATA: Goiter. 61-year-old female with a history of thyroid
nodules. She is status post prior left hemithyroidectomy. Patient
has undergone prior biopsy of right-sided thyroid nodules in 4764
and again in 0599.

EXAM:
THYROID ULTRASOUND
TECHNIQUE: Ultrasound examination of the thyroid gland and adjacent soft
tissues was performed.

[Series 1: us thyroid · 0.07mm/px · 13 of 44 slices shown]
[im 1/44]
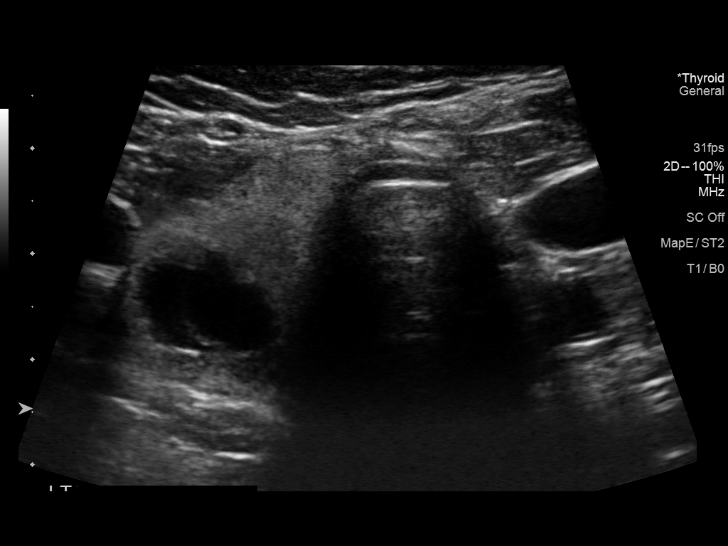
[im 4/44]
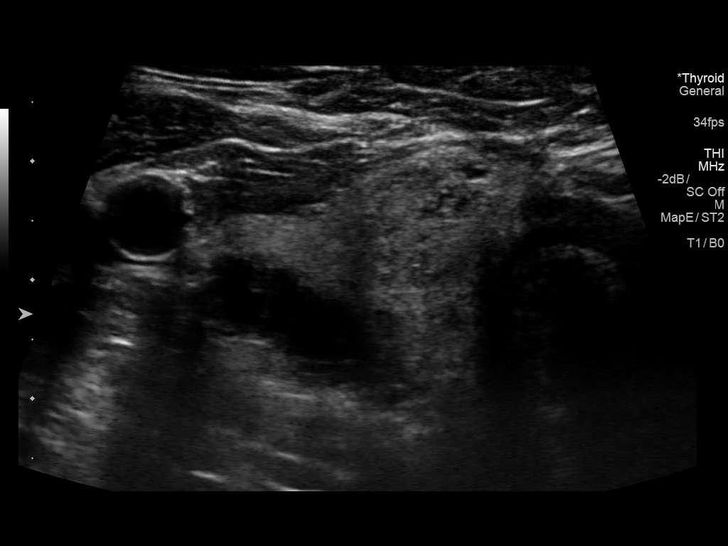
[im 8/44]
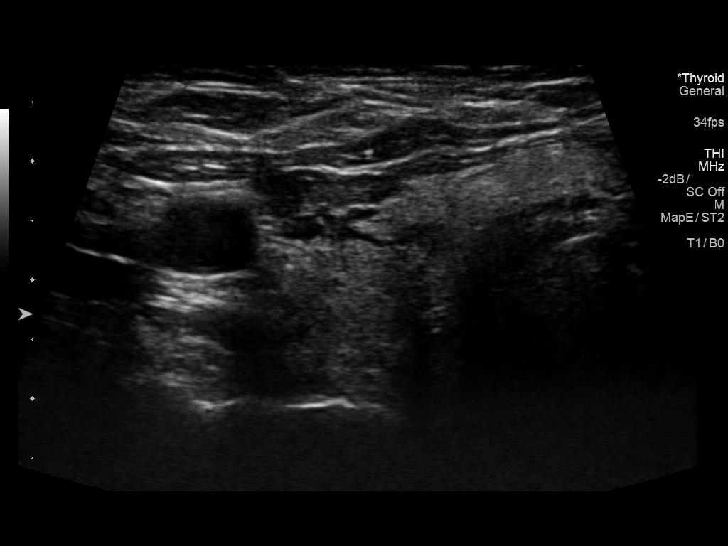
[im 11/44]
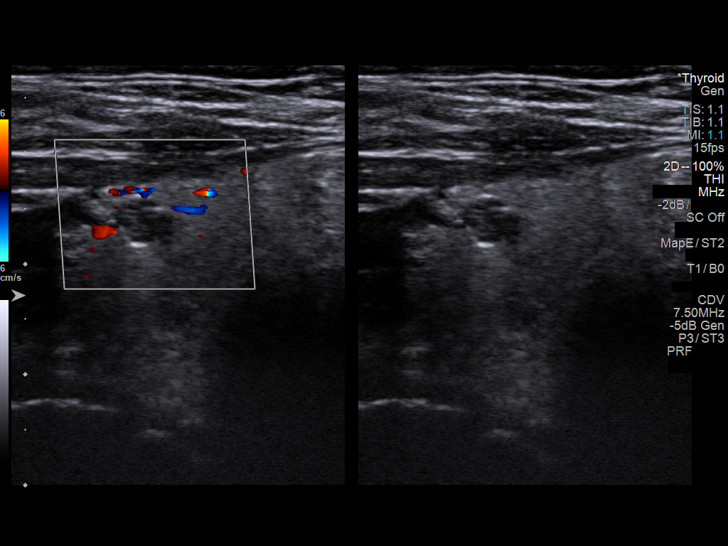
[im 15/44]
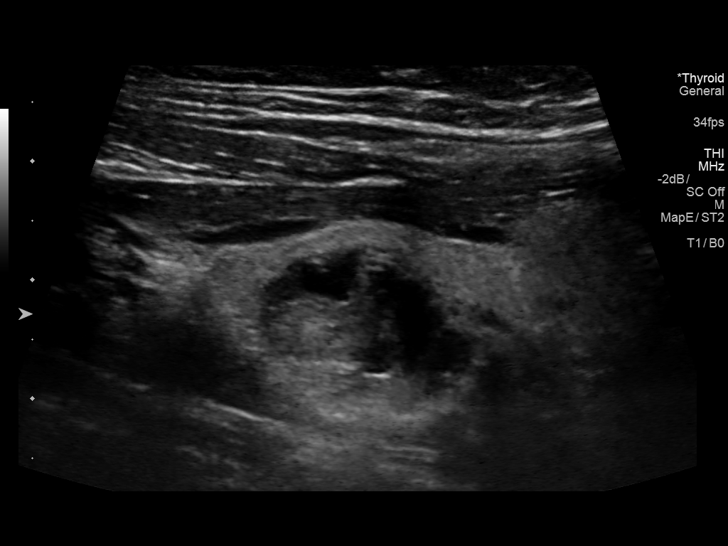
[im 18/44]
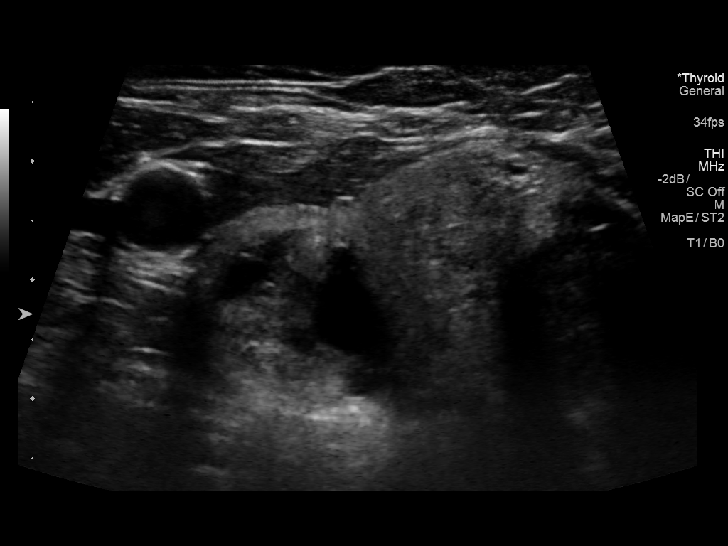
[im 22/44]
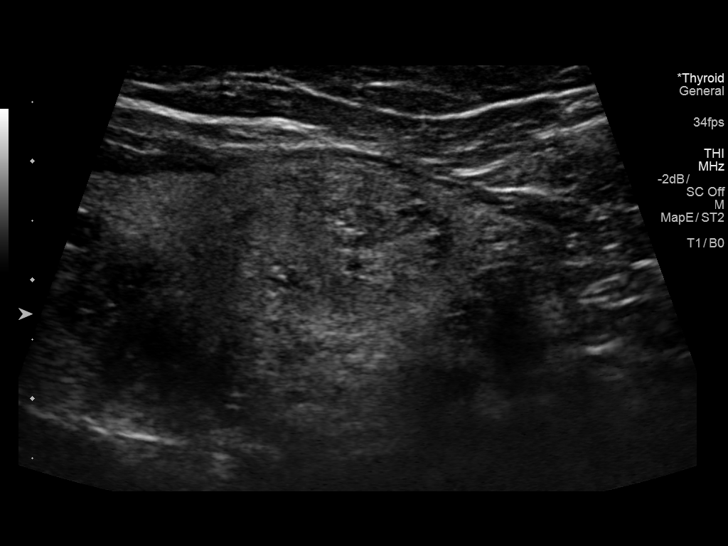
[im 26/44]
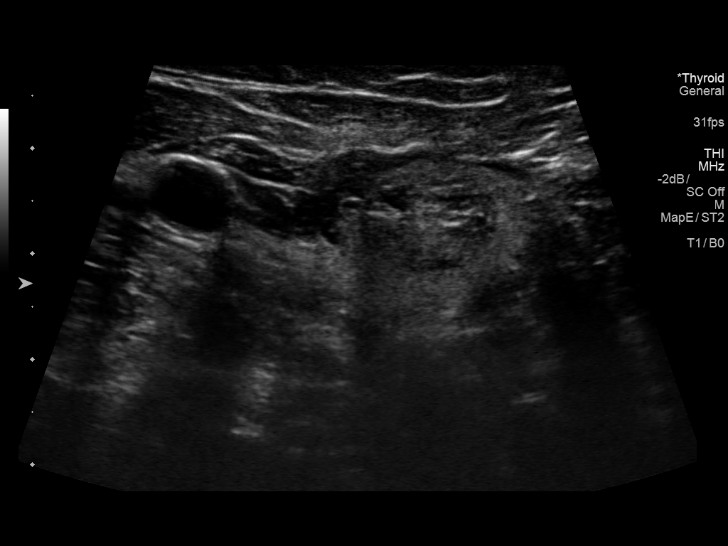
[im 29/44]
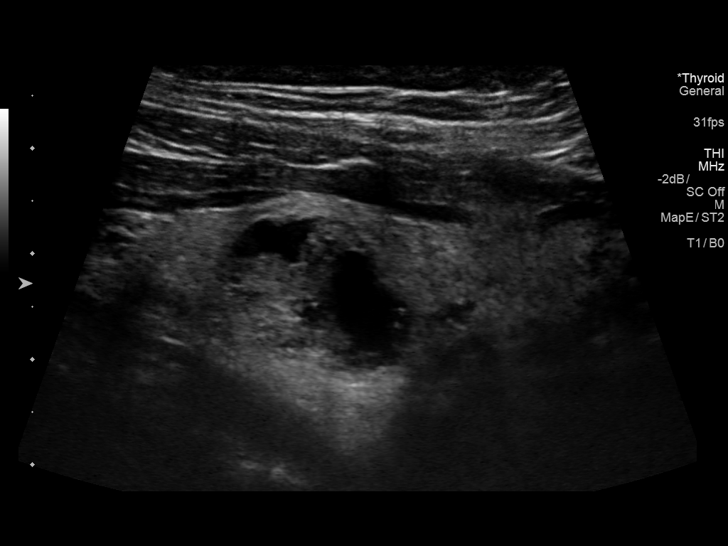
[im 33/44]
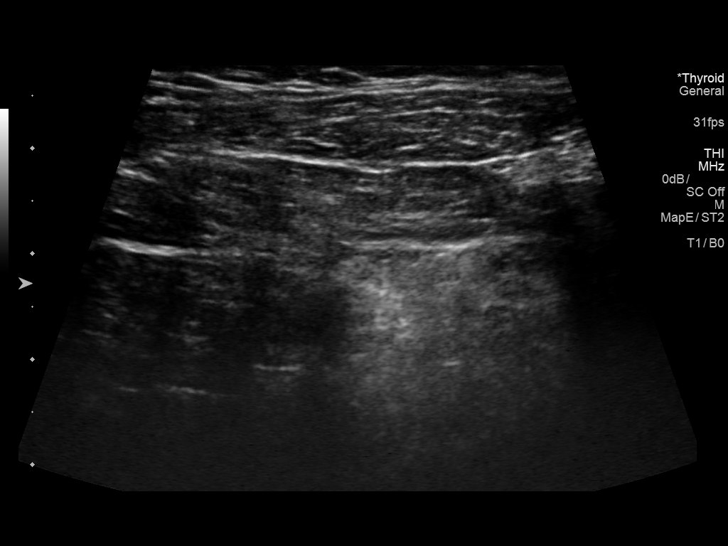
[im 36/44]
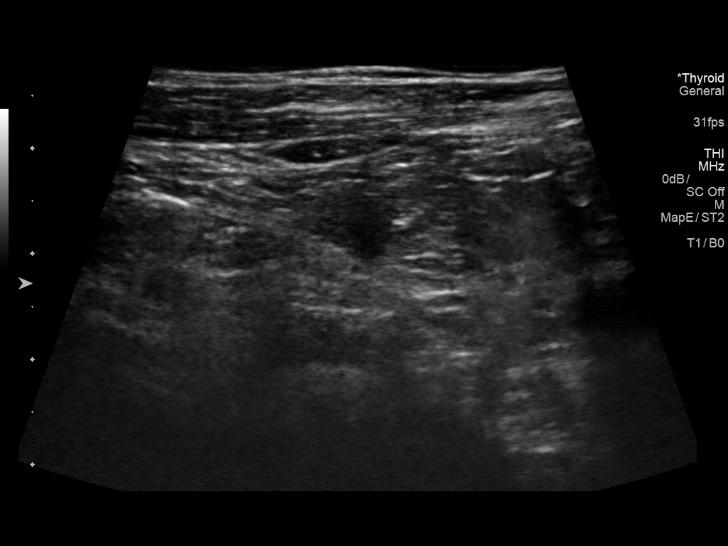
[im 40/44]
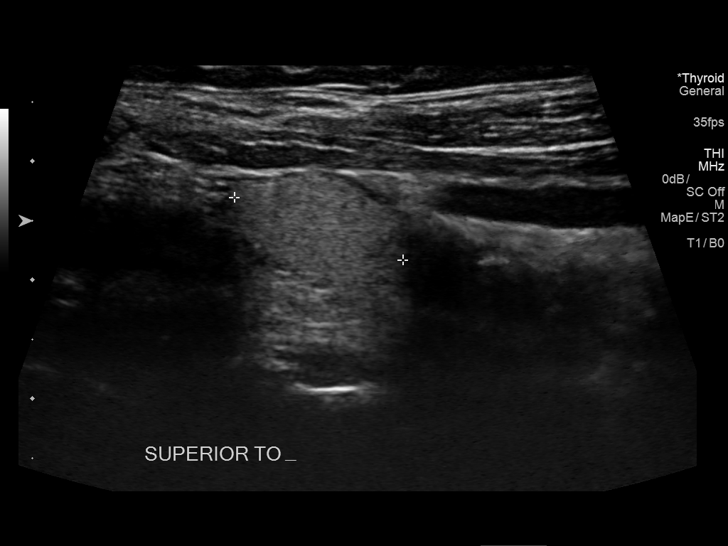
[im 44/44]
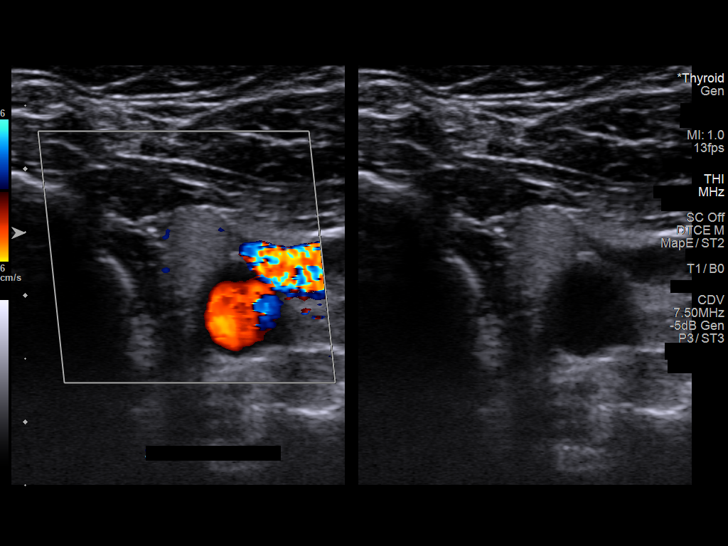

[13 of 25 positions shown; findings below may reference images not displayed]

FINDINGS: Parenchymal Echotexture: Moderately heterogenous

Isthmus: Surgically absent

Right lobe: 4.7 x 1.9 x 2.5 cm

Left lobe: Surgically absent

_________________________________________________________

Estimated total number of nodules >/= 1 cm: 3

Number of spongiform nodules >/=  2 cm not described below (TR1): 0

Number of mixed cystic and solid nodules >/= 1.5 cm not described
below (TR2): 0

_________________________________________________________

The previously biopsied nodule in the right mid gland remains stable
at 1.9 x 1.6 x 1.5 cm. No significant interval change.

The previously biopsied nodule in the right inferior gland measures
1.7 x 1.7 x 1.1 cm, unchanged.

Stable 1.3 x 0.9 x 0.7 cm nodule versus residual thyroid tissue
superior to the left thyroid resection bed. No suspicious features.
IMPRESSION: 1. Continued stability of previously biopsied right-sided thyroid
nodules.
2. No significant interval change in the 1.3 cm thyroid nodule
versus residual thyroid tissue superior to the left thyroid
resection bed.

The above is in keeping with the ACR TI-RADS recommendations - [HOSPITAL] 0599;[DATE].

## 2019-03-08 ENCOUNTER — Ambulatory Visit: Payer: BLUE CROSS/BLUE SHIELD

## 2019-03-10 ENCOUNTER — Ambulatory Visit
Admission: RE | Admit: 2019-03-10 | Discharge: 2019-03-10 | Disposition: A | Discharge status: Home / Self Care | Payer: BLUE CROSS/BLUE SHIELD | Source: Ambulatory Visit | Attending: Endocrinology | Admitting: Endocrinology

## 2019-03-10 ENCOUNTER — Other Ambulatory Visit: Payer: Self-pay | Admitting: Endocrinology

## 2019-03-10 DIAGNOSIS — E049 Nontoxic goiter, unspecified: Secondary | ICD-10-CM

## 2019-03-17 ENCOUNTER — Ambulatory Visit: Payer: BLUE CROSS/BLUE SHIELD | Attending: Internal Medicine

## 2019-03-17 DIAGNOSIS — Z23 Encounter for immunization: Secondary | ICD-10-CM | POA: Insufficient documentation

## 2019-03-17 NOTE — Progress Notes (Signed)
   Covid-19 Vaccination Clinic  Name:  [name]    MRN: [id number] DOB: 1955/12/23  03/17/2019  [name] was observed post Covid-19 immunization for 15 minutes without incidence. She was provided with Vaccine Information Sheet and instruction to access the V-Safe system.   [name] was instructed to call 911 with any severe reactions post vaccine: Marland Kitchen Difficulty breathing  . Swelling of your face and throat  . A fast heartbeat  . A bad rash all over your body  . Dizziness and weakness    Immunizations Administered    Name Date Dose VIS Date Route   Pfizer COVID-19 Vaccine 03/17/2019  1:31 PM 0.3 mL 01/20/2019 Intramuscular   Manufacturer: [place]   Lot: [id number]   [place]: [id number]

## 2019-03-25 ENCOUNTER — Ambulatory Visit: Payer: BLUE CROSS/BLUE SHIELD

## 2019-04-11 ENCOUNTER — Ambulatory Visit: Payer: BLUE CROSS/BLUE SHIELD | Attending: Internal Medicine

## 2019-04-11 DIAGNOSIS — Z23 Encounter for immunization: Secondary | ICD-10-CM | POA: Insufficient documentation

## 2019-04-11 NOTE — Progress Notes (Signed)
   Covid-19 Vaccination Clinic  Name:  [name]    MRN: [id number] DOB: November 06, 1955  04/11/2019  [name] was observed post Covid-19 immunization for 15 minutes without incident. She was provided with Vaccine Information Sheet and instruction to access the V-Safe system.   [name] was instructed to call 911 with any severe reactions post vaccine: Marland Kitchen Difficulty breathing  . Swelling of face and throat  . A fast heartbeat  . A bad rash all over body  . Dizziness and weakness   Immunizations Administered    Name Date Dose VIS Date Route   Pfizer COVID-19 Vaccine 04/11/2019  1:21 PM 0.3 mL 01/20/2019 Intramuscular   Manufacturer: [place]   Lot: [id number]   [place]: [id number]

## 2019-05-26 ENCOUNTER — Other Ambulatory Visit: Payer: Self-pay | Admitting: Endocrinology

## 2019-05-26 DIAGNOSIS — E049 Nontoxic goiter, unspecified: Secondary | ICD-10-CM

## 2020-06-12 IMAGING — US US THYROID
1 series · 13 of 25 positions shown · non-contrast
Comparison: 10/20/2017

CLINICAL DATA: 63-year-old female with a history of thyroid goiter

EXAM:
THYROID ULTRASOUND
TECHNIQUE: Ultrasound examination of the thyroid gland and adjacent soft
tissues was performed.

[Series 1: us thyroid · 0.06mm/px · 49 acquisitions, 13 frames shown]
[im 1/49]
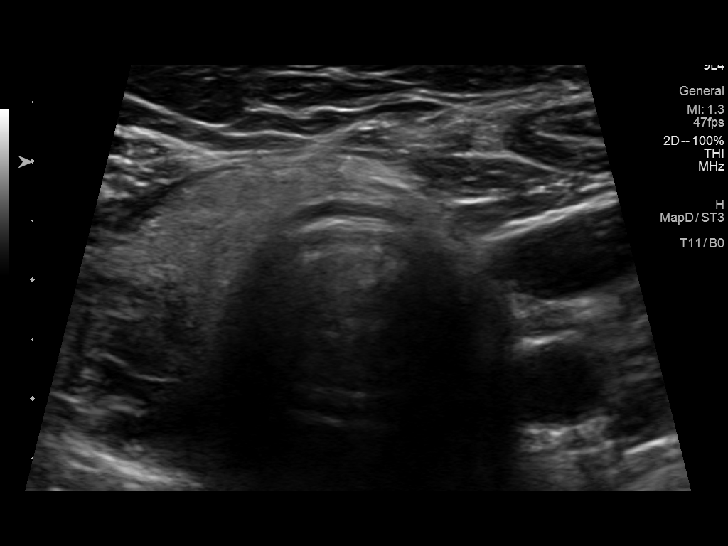
[im 5/49]
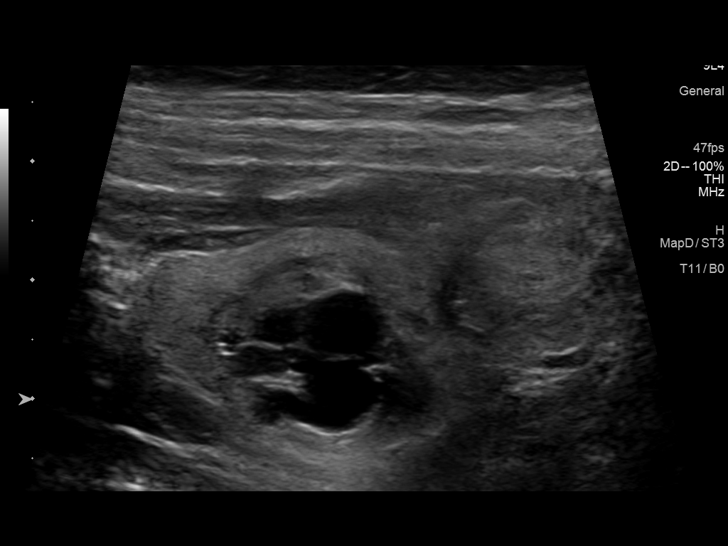
[im 9/49]
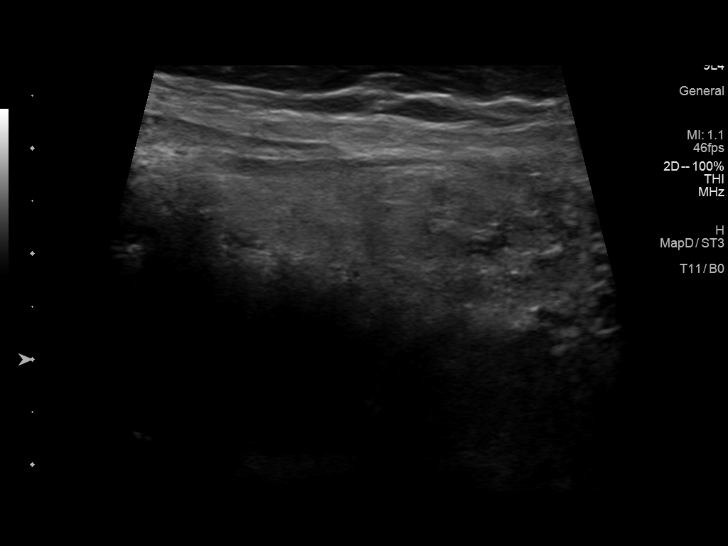
[im 13/49]
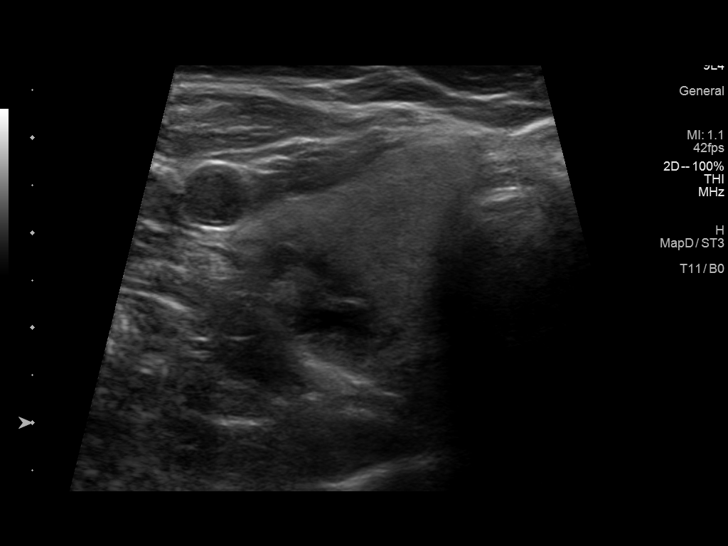
[im 17/49]
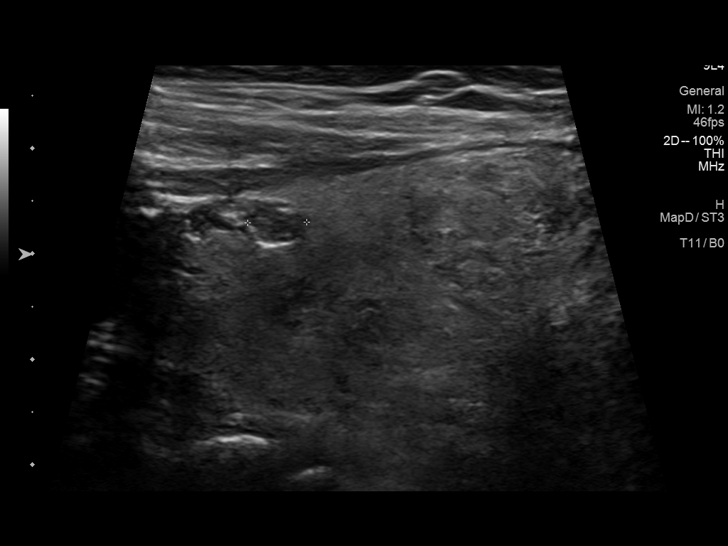
[im 21/49]
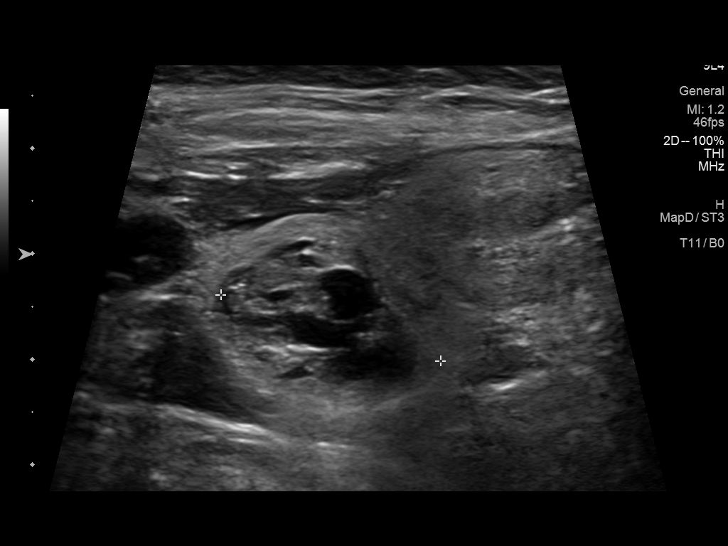
[im 25/49]
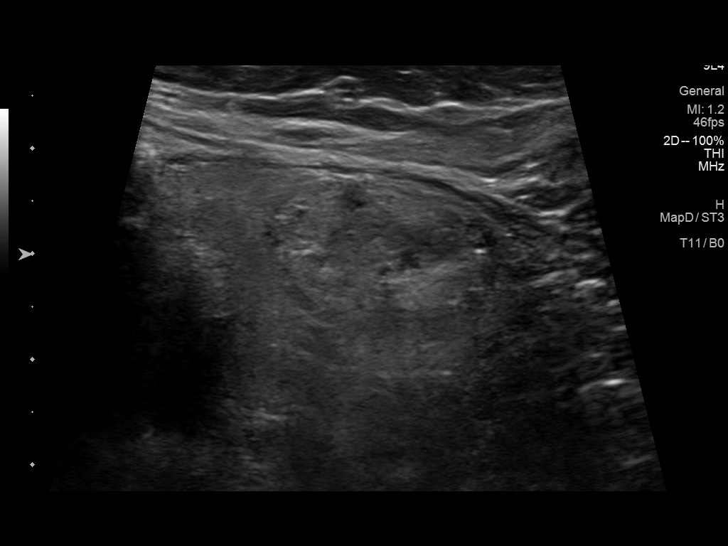
[im 29/49]
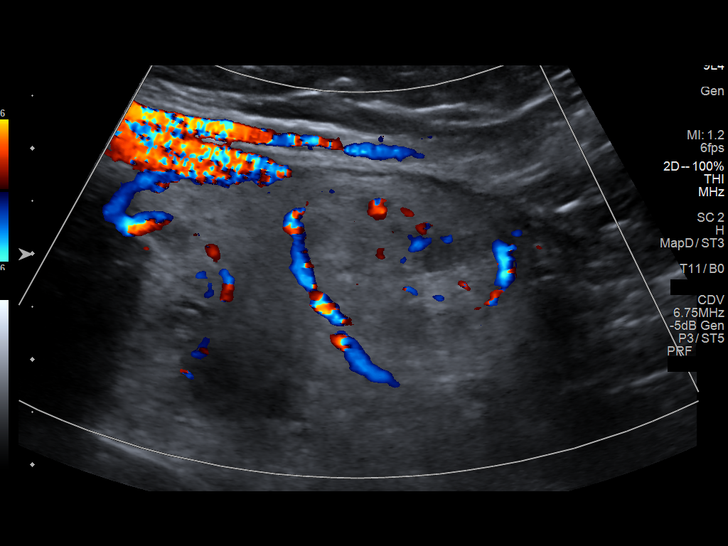
[im 33/49]
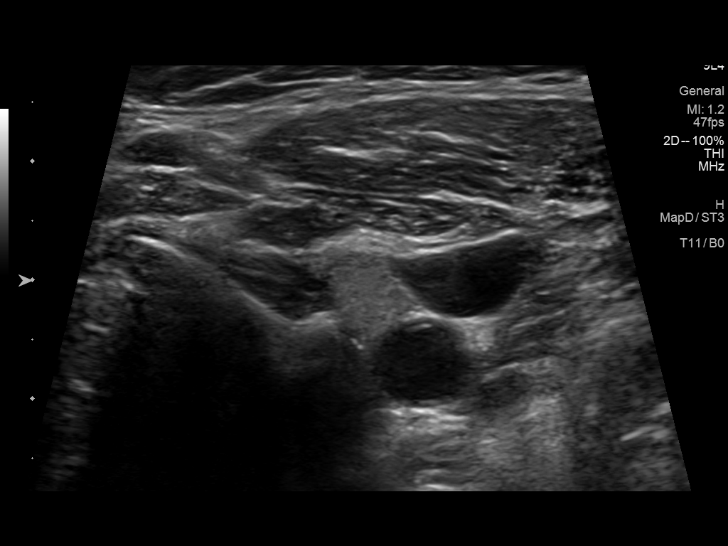
[im 37/49]
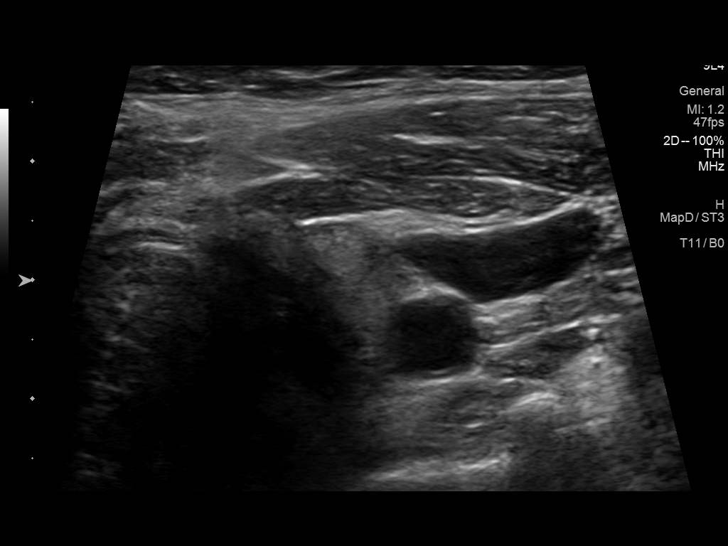
[im 41/49]
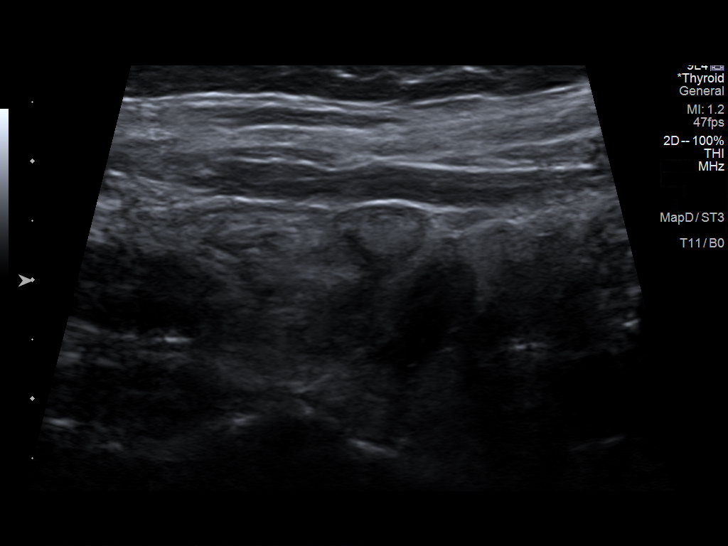
[im 45/49]
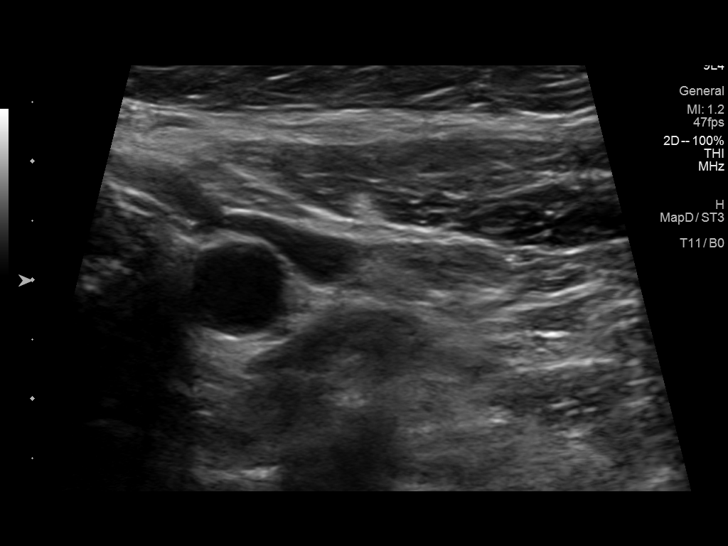
[im 49/49]
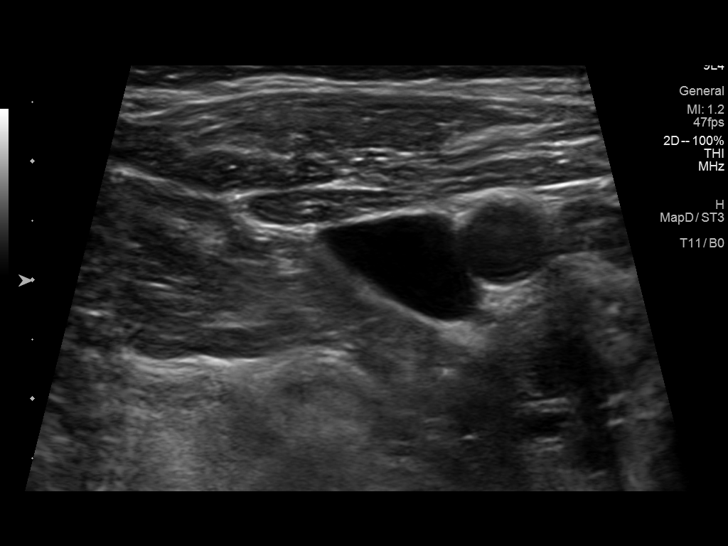

[13 of 25 positions shown; findings below may reference images not displayed]

FINDINGS: Parenchymal Echotexture: Mildly heterogenous

Isthmus: 0.2 cm

Right lobe: 4.9 cm x 2.2 cm x 2.3 cm

Left lobe: Surgically absent

_________________________________________________________

Estimated total number of nodules >/= 1 cm: 2

Number of spongiform nodules >/=  2 cm not described below (TR1): 0

Number of mixed cystic and solid nodules >/= 1.5 cm not described
below (TR2): 0

_________________________________________________________

Nodule labeled 1 in the superior right thyroid, 6 mm with spongiform
characteristics and does not meet criteria for surveillance or
biopsy.

Nodule labeled 2 in the right mid thyroid with cystic and solid
characteristics, previously biopsied. Assuming a benign result, no
further specific follow-up would be indicated.

Nodule labeled 3 inferior right thyroid, 2.2 cm, previously biopsied
and remains TR 3. Assuming a benign result previously, no further
specific follow-up would be indicated.

Two adjacent soft tissue foci in the left surgical bed measuring
cm and 0.7 cm, unchanged from the comparison.

No adenopathy
IMPRESSION: Surgical changes of left hemithyroidectomy. Similar appearance of
nodular residual tissue in the left surgical thyroid bed.

Multinodular right thyroid gland with previous biopsies of 2
nodules. Assuming prior benign result, no further specific follow-up
would be indicated.

Recommendations follow those established by the new ACR TI-RADS
criteria ([HOSPITAL] 1842;[DATE]).

## 2021-04-17 ENCOUNTER — Other Ambulatory Visit: Payer: Self-pay | Admitting: Endocrinology

## 2021-04-17 DIAGNOSIS — E039 Hypothyroidism, unspecified: Secondary | ICD-10-CM

## 2022-04-20 ENCOUNTER — Other Ambulatory Visit: Payer: Self-pay | Admitting: Endocrinology

## 2022-04-20 DIAGNOSIS — E010 Iodine-deficiency related diffuse (endemic) goiter: Secondary | ICD-10-CM

## 2022-05-15 ENCOUNTER — Ambulatory Visit
Admission: RE | Admit: 2022-05-15 | Discharge: 2022-05-15 | Disposition: A | Discharge status: Home / Self Care | Payer: PPO | Source: Ambulatory Visit | Attending: Endocrinology | Admitting: Endocrinology

## 2022-05-15 DIAGNOSIS — E010 Iodine-deficiency related diffuse (endemic) goiter: Secondary | ICD-10-CM

## 2022-06-09 ENCOUNTER — Other Ambulatory Visit: Payer: Self-pay | Admitting: Endocrinology

## 2022-06-09 DIAGNOSIS — E041 Nontoxic single thyroid nodule: Secondary | ICD-10-CM

## 2022-07-02 ENCOUNTER — Ambulatory Visit
Admission: RE | Admit: 2022-07-02 | Discharge: 2022-07-02 | Disposition: A | Discharge status: Home / Self Care | Payer: PPO | Source: Ambulatory Visit | Attending: Endocrinology | Admitting: Endocrinology

## 2022-07-02 ENCOUNTER — Other Ambulatory Visit (HOSPITAL_COMMUNITY)
Admission: RE | Admit: 2022-07-02 | Discharge: 2022-07-02 | Disposition: A | Discharge status: Home / Self Care | Payer: PPO | Source: Ambulatory Visit | Attending: Endocrinology | Admitting: Endocrinology

## 2022-07-02 DIAGNOSIS — E041 Nontoxic single thyroid nodule: Secondary | ICD-10-CM

## 2022-07-02 NOTE — Procedures (Signed)
PROCEDURE SUMMARY:  Using direct ultrasound guidance, 5 passes were made using 25 g needles into the nodule within the right inferior lobe of the thyroid.   Ultrasound was used to confirm needle placements on all occasions.   EBL = trace  Specimens were sent to Pathology for analysis.  See procedure note under Imaging tab in Epic for full procedure details.  Kennieth Francois PA-C 07/02/2022 3:45 PM

## 2022-07-07 LAB — CYTOLOGY - NON PAP

## 2022-07-24 ENCOUNTER — Encounter (HOSPITAL_COMMUNITY): Payer: Self-pay

## 2023-05-04 ENCOUNTER — Other Ambulatory Visit (HOSPITAL_COMMUNITY): Payer: Self-pay | Admitting: Gastroenterology

## 2023-05-04 DIAGNOSIS — K760 Fatty (change of) liver, not elsewhere classified: Secondary | ICD-10-CM

## 2023-05-13 ENCOUNTER — Ambulatory Visit (HOSPITAL_COMMUNITY)
Admission: RE | Admit: 2023-05-13 | Discharge: 2023-05-13 | Disposition: A | Discharge status: Home / Self Care | Source: Ambulatory Visit | Attending: Gastroenterology | Admitting: Gastroenterology

## 2023-05-13 DIAGNOSIS — K760 Fatty (change of) liver, not elsewhere classified: Secondary | ICD-10-CM | POA: Diagnosis present

## 2023-12-06 ENCOUNTER — Other Ambulatory Visit: Payer: Self-pay | Admitting: Endocrinology

## 2023-12-06 DIAGNOSIS — E049 Nontoxic goiter, unspecified: Secondary | ICD-10-CM

## 2023-12-13 ENCOUNTER — Other Ambulatory Visit

## 2023-12-20 ENCOUNTER — Ambulatory Visit
Admission: RE | Admit: 2023-12-20 | Discharge: 2023-12-20 | Disposition: A | Source: Ambulatory Visit | Attending: Endocrinology | Admitting: Endocrinology

## 2023-12-20 DIAGNOSIS — E049 Nontoxic goiter, unspecified: Secondary | ICD-10-CM
